# Patient Record
Sex: Female | Born: 1970 | Race: White | Hispanic: No | Marital: Married | Smoking: Never smoker
Health system: Southern US, Community
[De-identification: ages and names within clinical notes are randomized; demographics above are authoritative.]

## PROBLEM LIST (undated history)

## (undated) DIAGNOSIS — R51 Headache: Secondary | ICD-10-CM

## (undated) DIAGNOSIS — T7840XA Allergy, unspecified, initial encounter: Secondary | ICD-10-CM

## (undated) DIAGNOSIS — Z8619 Personal history of other infectious and parasitic diseases: Secondary | ICD-10-CM

## (undated) DIAGNOSIS — M25559 Pain in unspecified hip: Secondary | ICD-10-CM

## (undated) DIAGNOSIS — R519 Headache, unspecified: Secondary | ICD-10-CM

## (undated) DIAGNOSIS — N83209 Unspecified ovarian cyst, unspecified side: Secondary | ICD-10-CM

## (undated) HISTORY — PX: WISDOM TOOTH EXTRACTION: SHX21

## (undated) HISTORY — DX: Headache: R51

## (undated) HISTORY — DX: Headache, unspecified: R51.9

## (undated) HISTORY — PX: KNEE SURGERY: SHX244

## (undated) HISTORY — DX: Unspecified ovarian cyst, unspecified side: N83.209

## (undated) HISTORY — DX: Allergy, unspecified, initial encounter: T78.40XA

## (undated) HISTORY — DX: Personal history of other infectious and parasitic diseases: Z86.19

## (undated) HISTORY — DX: Pain in unspecified hip: M25.559

---

## 2004-05-26 ENCOUNTER — Inpatient Hospital Stay (HOSPITAL_COMMUNITY): Admission: AD | Admit: 2004-05-26 | Discharge: 2004-05-27 | Payer: Self-pay | Admitting: Obstetrics and Gynecology

## 2004-06-08 ENCOUNTER — Inpatient Hospital Stay (HOSPITAL_COMMUNITY): Admission: AD | Admit: 2004-06-08 | Discharge: 2004-06-09 | Payer: Self-pay | Admitting: Obstetrics and Gynecology

## 2004-06-15 ENCOUNTER — Inpatient Hospital Stay (HOSPITAL_COMMUNITY): Admission: AD | Admit: 2004-06-15 | Discharge: 2004-06-18 | Payer: Self-pay | Admitting: Obstetrics and Gynecology

## 2004-07-28 ENCOUNTER — Other Ambulatory Visit: Admission: RE | Admit: 2004-07-28 | Discharge: 2004-07-28 | Payer: Self-pay | Admitting: Obstetrics and Gynecology

## 2005-04-23 ENCOUNTER — Encounter: Admission: RE | Admit: 2005-04-23 | Discharge: 2005-04-23 | Payer: Self-pay | Admitting: Emergency Medicine

## 2005-07-23 ENCOUNTER — Ambulatory Visit (HOSPITAL_COMMUNITY): Admission: RE | Admit: 2005-07-23 | Discharge: 2005-07-23 | Payer: Self-pay | Admitting: Obstetrics and Gynecology

## 2005-08-01 ENCOUNTER — Encounter: Admission: RE | Admit: 2005-08-01 | Discharge: 2005-08-01 | Payer: Self-pay | Admitting: Obstetrics and Gynecology

## 2005-10-05 ENCOUNTER — Other Ambulatory Visit: Admission: RE | Admit: 2005-10-05 | Discharge: 2005-10-05 | Payer: Self-pay | Admitting: Obstetrics and Gynecology

## 2006-08-23 ENCOUNTER — Encounter: Admission: RE | Admit: 2006-08-23 | Discharge: 2006-08-23 | Payer: Self-pay | Admitting: Emergency Medicine

## 2006-12-10 ENCOUNTER — Ambulatory Visit (HOSPITAL_COMMUNITY): Admission: RE | Admit: 2006-12-10 | Discharge: 2006-12-10 | Payer: Self-pay | Admitting: Obstetrics and Gynecology

## 2007-05-30 ENCOUNTER — Encounter (INDEPENDENT_AMBULATORY_CARE_PROVIDER_SITE_OTHER): Payer: Self-pay | Admitting: Obstetrics and Gynecology

## 2007-05-30 ENCOUNTER — Ambulatory Visit (HOSPITAL_COMMUNITY): Admission: RE | Admit: 2007-05-30 | Discharge: 2007-05-30 | Payer: Self-pay | Admitting: Obstetrics and Gynecology

## 2008-04-05 ENCOUNTER — Inpatient Hospital Stay (HOSPITAL_COMMUNITY): Admission: AD | Admit: 2008-04-05 | Discharge: 2008-04-05 | Payer: Self-pay | Admitting: Family Medicine

## 2008-04-07 ENCOUNTER — Encounter (INDEPENDENT_AMBULATORY_CARE_PROVIDER_SITE_OTHER): Payer: Self-pay | Admitting: Obstetrics and Gynecology

## 2008-04-07 ENCOUNTER — Ambulatory Visit: Payer: Self-pay | Admitting: Vascular Surgery

## 2008-04-07 ENCOUNTER — Ambulatory Visit: Admission: RE | Admit: 2008-04-07 | Discharge: 2008-04-07 | Payer: Self-pay | Admitting: Obstetrics and Gynecology

## 2008-04-23 ENCOUNTER — Inpatient Hospital Stay (HOSPITAL_COMMUNITY): Admission: AD | Admit: 2008-04-23 | Discharge: 2008-04-25 | Payer: Self-pay | Admitting: Obstetrics and Gynecology

## 2008-04-27 ENCOUNTER — Ambulatory Visit: Admission: RE | Admit: 2008-04-27 | Discharge: 2008-04-27 | Payer: Self-pay | Admitting: Obstetrics and Gynecology

## 2009-03-03 ENCOUNTER — Emergency Department (HOSPITAL_BASED_OUTPATIENT_CLINIC_OR_DEPARTMENT_OTHER): Admission: EM | Admit: 2009-03-03 | Discharge: 2009-03-03 | Payer: Self-pay | Admitting: Emergency Medicine

## 2009-11-21 LAB — HM MAMMOGRAPHY: HM Mammogram: NORMAL

## 2009-11-23 ENCOUNTER — Emergency Department (HOSPITAL_COMMUNITY): Admission: EM | Admit: 2009-11-23 | Discharge: 2009-11-23 | Payer: Self-pay | Admitting: Emergency Medicine

## 2010-11-21 NOTE — H&P (Signed)
[name], Maria [name]          ACCOUNT NO.:  [id number]   MEDICAL RECORD NO.:  [id number]          PATIENT TYPE:  AMB   LOCATION:  SDC                           FACILITY:  [facility]   PHYSICIAN:  Juluis Mire, M.D.   DATE OF BIRTH:  1971-03-15   DATE OF ADMISSION:  05/30/2007  DATE OF DISCHARGE:                              HISTORY & PHYSICAL   The patient is [name] 36-year gravida 2, para 1 female, last menstrual period  August 23, given estimated date of confinement of May 31, estimated  gestational age of [redacted] weeks.  Was seen in the office yesterday for  evaluation.  Fetal heart tones were not audible.  Subsequent ultrasound  evaluation revealed [name] nonviable pregnancy.  Crown-rump length was  consistent with approximately 10 weeks and 5 days.  The patient was  brought in at the present time for dilatation and evacuation for  management of nonviable first trimester pregnancy.  Blood type is [name]+.   ALLERGIES:  NO KNOWN DRUG ALLERGIES.   MEDICATIONS:  Prenatal vitamins.   PAST MEDICAL HISTORY:  Usual childhood diseases.  No sequelae.  Only  previous surgical history is knee surgery.   OBSTETRICAL HISTORY:  One prior vaginal delivery.   FAMILY HISTORY:  Noncontributory.   SOCIAL HISTORY:  There is no tobacco or alcohol use.   REVIEW OF SYSTEMS:  Noncontributory.   PHYSICAL EXAMINATION:  VITAL SIGNS:  The patient is afebrile with stable  vital signs.  HEENT:  The patient is normocephalic.  Pupils equal, round and reactive  to light and accommodation.  Extraocular movements were intact.  Sclerae  and conjunctivae were clear.  Oropharynx clear.  NECK:  Without thyromegaly.  BREASTS:  Not examined.  LUNGS:  Clear.  CARDIOVASCULAR:  Regular rhythm and rate with [name] grade 2/6 systolic  ejection murmur.  No clicks or gallops.  ABDOMEN:  Examination was benign.  No mass, organomegaly, or tenderness.  PELVIC:  Normal external genitalia.  Vaginal mucosa clear.  Cervix  unremarkable.  Uterus  feels to be approximately 10 weeks in size.  Adnexa unremarkable.  EXTREMITIES:  Trace edema.  NEUROLOGIC:  Grossly within normal limits.   IMPRESSION:  Nonviable first trimester pregnancy.   PLAN:  The patient to undergo dilatation and evacuation.  The risks of  surgery have been explained, including the risk of infection.  Risk of  hemorrhage could require transfusion with the risk of AIDS or hepatitis.  Continued  bleeding could require hysterectomy.  Risk of uterine perforation could  lead to injury to adjacent organs, requiring exploratory surgery.  Risk  of deep venous thrombosis and pulmonary embolus.  The patient will not  require RhoGAM.  The patient expressed understanding of indications and  risks.      Juluis Mire, M.D.  Electronically Signed     JSM/MEDQ  D:  05/30/2007  T:  05/30/2007  Job:  [id number]

## 2010-11-21 NOTE — Op Note (Signed)
[name], [name]          ACCOUNT NO.:  [id number]   MEDICAL RECORD NO.:  [id number]          PATIENT TYPE:  AMB   LOCATION:  SDC                           [facility]  [facility]   PHYSICIAN:  Juluis Mire, M.D.   DATE OF BIRTH:  02/17/1971   DATE OF PROCEDURE:  05/30/2007  DATE OF DISCHARGE:                               OPERATIVE REPORT   PREOPERATIVE DIAGNOSIS:  Nonviable first trimester of pregnancy.   POSTOPERATIVE DIAGNOSIS:  Nonviable first trimester of pregnancy.   OPERATIVE PROCEDURE:  1. Paracervical block.  2. Cervical dilation with the intrauterine curettings and evacuation.   SURGEON:  Juluis Mire, M.D.   ANESTHESIA:  Sedation with paracervical block.   ESTIMATED BLOOD LOSS:  400-500 mL.   PACKS AND DRAINS:  None.   INTRAOPERATIVE BLOOD REPLACEMENT:  none.   COMPLICATIONS:  None.   INDICATIONS:  In dictated history and physical.   PROCEDURE:  The patient was taken to the OR and placed in a supine  position.  After a satisfactory level of sedation, she was placed in the  dorsal lithotomy position using the Allen stirrups.  At this point in  time, the patient was draped in a sterile field.  A speculum was placed  in the vaginal vault.  The cervix and vagina were cleansed with  Betadine.  Paracervical block was instituted using 1% Nesacaine.  Her  cervix was secured with a single-tooth tenaculum.  The uterus sounded to  11 cm.  The cervix was serially dilated to a size 29 Pratt dilator.  A  size 8 suction curette was introduced and we began emptying the  intrauterine contents.  She had very adherent products of conception and  it took several passes with the suction curette and additional sharp  curetting until we were eventually able to get the uterus to start to  contract down.  She was given IV Pitocin.  We continued suction and  sharp curetting until we felt that all quadrants were clear.  The uterus  was contracting down well at this time and there  was no evidence of  further retained products.  We did suffer about 400-500 mL of blood  loss.  At this point in time, there  was no active bleeding, no signs of perforation.  The speculum and  single-tooth tenaculum were removed.  The patient was taken out of the  dorsal lithotomy position and once alert, the patient will be  transferred to recovery room in good condition.  Sponge, instrument and  needle counts were reported as correct by the circulating nurse.      Juluis Mire, M.D.  Electronically Signed     JSM/MEDQ  D:  05/30/2007  T:  05/31/2007  Job:  [id number]

## 2011-04-09 LAB — CBC
HCT: 31.6 — ABNORMAL LOW
HCT: 36.1
Hemoglobin: 10.5 — ABNORMAL LOW
Hemoglobin: 12.1
MCHC: 33.2
MCHC: 33.5
MCV: 87.8
MCV: 88
Platelets: 169
Platelets: 224
RBC: 3.59 — ABNORMAL LOW
RBC: 4.11
RDW: 14.6
RDW: 14.7
WBC: 10.9 — ABNORMAL HIGH
WBC: 11.7 — ABNORMAL HIGH

## 2011-04-09 LAB — RPR: RPR Ser Ql: NONREACTIVE

## 2011-04-17 LAB — CBC
HCT: 41.7
Hemoglobin: 14.2
MCHC: 34.1
MCV: 86.3
Platelets: 234
RBC: 4.83
RDW: 13.9
WBC: 9.3

## 2011-11-09 ENCOUNTER — Encounter (HOSPITAL_COMMUNITY): Payer: Self-pay | Admitting: Emergency Medicine

## 2011-11-09 ENCOUNTER — Emergency Department (HOSPITAL_COMMUNITY): Payer: 59

## 2011-11-09 ENCOUNTER — Emergency Department (HOSPITAL_COMMUNITY)
Admission: EM | Admit: 2011-11-09 | Discharge: 2011-11-10 | Disposition: A | Discharge status: Home / Self Care | Payer: 59 | Attending: Emergency Medicine | Admitting: Emergency Medicine

## 2011-11-09 DIAGNOSIS — R079 Chest pain, unspecified: Secondary | ICD-10-CM | POA: Insufficient documentation

## 2011-11-09 DIAGNOSIS — Z79899 Other long term (current) drug therapy: Secondary | ICD-10-CM | POA: Insufficient documentation

## 2011-11-09 DIAGNOSIS — R110 Nausea: Secondary | ICD-10-CM | POA: Insufficient documentation

## 2011-11-09 DIAGNOSIS — M542 Cervicalgia: Secondary | ICD-10-CM | POA: Insufficient documentation

## 2011-11-09 DIAGNOSIS — Z7982 Long term (current) use of aspirin: Secondary | ICD-10-CM | POA: Insufficient documentation

## 2011-11-09 DIAGNOSIS — I4949 Other premature depolarization: Secondary | ICD-10-CM | POA: Insufficient documentation

## 2011-11-09 LAB — CBC
HCT: 40.7 % (ref 36.0–46.0)
Hemoglobin: 14.1 g/dL (ref 12.0–15.0)
MCH: 29.6 pg (ref 26.0–34.0)
MCHC: 34.6 g/dL (ref 30.0–36.0)
MCV: 85.3 fL (ref 78.0–100.0)
Platelets: 216 10*3/uL (ref 150–400)
RDW: 12.7 % (ref 11.5–15.5)

## 2011-11-09 LAB — BASIC METABOLIC PANEL
BUN: 11 mg/dL (ref 6–23)
Chloride: 100 mEq/L (ref 96–112)
Creatinine, Ser: 0.66 mg/dL (ref 0.50–1.10)
GFR calc Af Amer: >90 mL/min (ref >90)
GFR calc non Af Amer: >90 mL/min (ref >90)
Potassium: 3.7 mEq/L (ref 3.5–5.1)
Sodium: 137 mEq/L (ref 135–145)

## 2011-11-09 LAB — DIFFERENTIAL
Basophils Absolute: 0 10*3/uL (ref 0.0–0.1)
Basophils Relative: 0 % (ref 0–1)
Eosinophils Absolute: 0.3 10*3/uL (ref 0.0–0.7)
Lymphocytes Relative: 36 % (ref 12–46)
Lymphs Abs: 3.4 10*3/uL (ref 0.7–4.0)
Monocytes Absolute: 0.8 10*3/uL (ref 0.1–1.0)
Monocytes Relative: 8 % (ref 3–12)
Neutro Abs: 5 10*3/uL (ref 1.7–7.7)
Neutrophils Relative %: 53 % (ref 43–77)

## 2011-11-09 LAB — POCT I-STAT TROPONIN I: Troponin i, poc: 0.01 ng/mL (ref 0.00–0.08)

## 2011-11-09 NOTE — ED Notes (Signed)
C/o sudden onset of palpitations, chest pain that radiates to neck  and nausea that began before dinner. Denies SOB and dizziness. Unifocal PVCs on  Monitor. Pt is alert and oriented.

## 2011-11-09 NOTE — ED Notes (Signed)
PT. REPORTS MID CHEST PAIN / NECK PAIN WITH PALPITATIONS AND AND NAUSEA ONSET YESTERDAY .

## 2011-11-10 LAB — D-DIMER, QUANTITATIVE: D-Dimer, Quant: <0.22 ug/mL-FEU (ref 0.00–0.48)

## 2011-11-10 LAB — TROPONIN I
Troponin I: <0.3 ng/mL (ref <0.30)
Troponin I: <0.3 ng/mL (ref <0.30)

## 2011-11-10 MED ORDER — ASPIRIN 81 MG PO CHEW
324.0000 mg | CHEWABLE_TABLET | Freq: Once | ORAL | Status: AC
  Administered 2011-11-10: 324 mg via ORAL
  Filled 2011-11-10: qty 4

## 2011-11-10 MED ORDER — HYDROCODONE-ACETAMINOPHEN 5-325 MG PO TABS: 1.0000 | ORAL_TABLET | Freq: Four times a day (QID) | ORAL | Status: AC | PRN

## 2011-11-10 MED ORDER — HYDROCODONE-ACETAMINOPHEN 5-325 MG PO TABS: 1.0000 | ORAL_TABLET | Freq: Once | ORAL | Status: DC

## 2011-11-10 MED ORDER — ASPIRIN 81 MG PO CHEW: 81.0000 mg | CHEWABLE_TABLET | Freq: Every day | ORAL | Status: DC

## 2011-11-10 NOTE — Discharge Instructions (Signed)
Aspirin and Your Heart Aspirin affects the way your blood clots and helps "thin" the blood. Aspirin has many uses in heart disease. It may be used as a primary prevention to help reduce the risk of heart related events. It also can be used as a secondary measure to prevent more heart attacks or to prevent additional damage from blood clots.  ASPIRIN MAY HELP IF YOU:  Have had a heart attack or chest pain.   Have undergone open heart surgery such as CABG (Coronary Artery Bypass Surgery).   Have had coronary angioplasty with or without stents.   Have experienced a stroke or TIA (transient ischemic attack).   Have peripheral vascular disease (PAD).   Have chronic heart rhythm problems such as atrial fibrillation.   Are at risk for heart disease.  BEFORE STARTING ASPIRIN Before you start taking aspirin, your caregiver will need to review your medical history. Many things will need to be taken into consideration, such as:  Smoking status.   Blood pressure.   Diabetes.   Gender.   Weight.   Cholesterol level.  ASPIRIN DOSES  Aspirin should only be taken on the advice of your caregiver. Talk to your caregiver about how much aspirin you should take. Aspirin comes in different doses such as:   81 mg.   162 mg.   325 mg.   The aspirin dose you take may be affected by many factors, some of which include:   Your current medications, especially if your are taking blood-thinners or anti-platelet medicine.   Liver function.   Heart disease risk.   Age.   Aspirin comes in two forms:   Non-enteric-coated. This type of aspirin does not have a coating and is absorbed faster. Non-enteric coated aspirin is recommended for patients experiencing chest pain symptoms. This type of aspirin also comes in a chewable form.   Enteric-coated. This means the aspirin has a special coating that releases the medicine very slowly. Enteric-coated aspirin causes less stomach upset. This type of  aspirin should not be chewed or crushed.  ASPIRIN SIDE EFFECTS Daily use of aspirin can increase your risk of serious side effects, some of these include:  Increased bleeding. This can range from a cut that does not stop bleeding to more serious problems such as stomach bleeding or bleeding into the brain (Intracerebral bleeding).   Increased bruising.   Stomach upset.   An allergic reaction such as red, itchy skin.   Increased risk of bleeding when combined with non-steroidal anti-inflammatory medicine (NSAIDS).   Alcohol should be drank in moderation when taking aspirin. Alcohol can increase the risk of stomach bleeding when taken with aspirin.   Aspirin should not be given to children less than 18 years of age due to the association of Reye syndrome. Reye syndrome is a serious illness that can affect the brain and liver. Studies have linked Reye syndrome with aspirin use in children.   People that have nasal polyps have an increased risk of developing an aspirin allergy.  SEEK MEDICAL CARE IF:   You develop an allergic reaction such as:   Hives.   Itchy skin.   Swelling of the lips, tongue or face.   You develop stomach pain.   You have unusual bleeding or bruising.   You have ringing in your ears.  SEEK IMMEDIATE MEDICAL CARE IF:   You have severe chest pain, especially if the pain is crushing or pressure-like and spreads to the arms, back, neck, or jaw. THIS   IS AN EMERGENCY. Do not wait to see if the pain will go away. Get medical help at once. Call your local emergency services (911 in the [place] DO NOT drive yourself to the hospital.   You have stroke-like symptoms such as:   Loss of vision.   Difficulty talking.   Numbness or weakness on one side of your body.   Numbness or weakness in your arm or leg.   Not thinking clearly or feeling confused.   Your bowel movements are bloody, dark red or black in color.   You vomit or cough up blood.   You have blood  in your urine.   You have shortness of breath, coughing or wheezing.  MAKE SURE YOU:   Understand these instructions.   Will monitor your condition.   Seek immediate medical care if necessary.  Document Released: 06/07/2008 Document Revised: 06/14/2011 Document Reviewed: 06/07/2008 [facility] Patient Information 2012 [place], [place].  Call cardiology for followup. Start taking a baby aspirin a day. Return for new or persistent chest pain that does not resolve within 20 minutes.  RESOURCE GUIDE  Dental Problems  Patients with Medicaid: [facility] [phone] [address]                                           [phone] [address] Phone:  [phone]                                                  Phone:  [phone]  If unable to pay or uninsured, contact:  [facility] or [facility]. to become qualified for the adult dental clinic.  Chronic Pain Problems Contact [facility]  [phone] Patients need to be referred by their primary care doctor.  Insufficient Money for Medicine Contact [employer]:  call "211" or [facility] [phone].  No Primary Care Doctor Call [facility]  [phone] Other agencies that provide inexpensive medical care    [facility]  [phone]    [facility]  [phone]    [facility]  [phone]    [facility]  [phone]    [facility]  [phone]    [facility]  [phone]  Psychological Services [facility] Behavioral Health  [phone] [facility] Services  [phone] [facility] Mental Health   [phone] (emergency services [phone])  Substance Abuse Resources [facility]  [phone] [facility] [phone] [facility] [phone] Floydene Flock [phone] Residential & Outpatient Substance Abuse Program  7090162238  Abuse/Neglect [facility] Child Abuse  Hotline [phone] [facility] Child Abuse Hotline [phone] (After Hours)  Emergency Shelter [facility] [phone]  Maternity Homes [facility] at the [place] [facility] [phone] [place] [facility] [phone]  MRSA Hotline #:   737-347-3385    [facility]  Health Dept. [address] [place]                       [address]      [address]  Blondell Reveal Phone:  [phone]                                   Phone:  [phone]                 Phone:  [phone]  [facility] Mental Health Phone:  [phone]  [facility] Child Abuse Hotline [phone] (After Hours)

## 2011-11-10 NOTE — ED Provider Notes (Addendum)
History     CSN: [id number]  Arrival date & time 11/09/11  2058   First MD Initiated Contact with Patient 11/09/11 2318      Chief Complaint  Patient presents with  . Chest Pain    (Consider location/radiation/quality/duration/timing/severity/associated sxs/prior treatment) Patient is a 41 y.o. female presenting with chest pain. The history is provided by the patient and the spouse.  Chest Pain The chest pain began 3 - 5 hours ago. Chest pain occurs constantly. The chest pain is improving. At its most intense, the pain is at 9/10. The pain is currently at 7/10. The severity of the pain is moderate. The quality of the pain is described as aching and dull. The pain radiates to the left neck. Primary symptoms include nausea. Pertinent negatives for primary symptoms include no fever, no syncope, no shortness of breath, no cough, no wheezing, no palpitations, no abdominal pain, no vomiting and no dizziness.    patient with acute onset of midsternal chest pain at around 7:30 that moved to left lateral neck currently improving currently 7/10 did not take an aspirin. Associated with some nausea but no shortness of breath. Patient stated there was a family history of cardiac disease but with questioning no strong evidence of any cardiac events below the age of 67 there is a history of SVT.  History reviewed. No pertinent past medical history.  History reviewed. No pertinent past surgical history.  No family history on file.  History  Substance Use Topics  . Smoking status: Never Smoker   . Smokeless tobacco: Not on file  . Alcohol Use: No    OB History    Grav Para Term Preterm Abortions TAB SAB Ect Mult Living                  Review of Systems  Constitutional: Negative for fever.  HENT: Positive for neck pain.   Eyes: Negative for visual disturbance.  Respiratory: Negative for cough, shortness of breath and wheezing.   Cardiovascular: Positive for chest pain. Negative for  palpitations and syncope.  Gastrointestinal: Positive for nausea. Negative for vomiting and abdominal pain.  Genitourinary: Negative for dysuria.  Musculoskeletal: Negative for back pain.  Skin: Negative for rash.  Neurological: Negative for dizziness and headaches.  Hematological: Does not bruise/bleed easily.    Allergies  Review of patient's allergies indicates no known allergies.  Home Medications   Current Outpatient Rx  Name Route Sig Dispense Refill  . CETIRIZINE HCL 10 MG PO TABS Oral Take 10 mg by mouth daily.    . ADULT MULTIVITAMIN W/MINERALS CH Oral Take 1 tablet by mouth daily.    . ASPIRIN 81 MG PO CHEW Oral Chew 1 tablet (81 mg total) by mouth daily. 30 tablet 3    BP 90/43  Pulse 69  Temp(Src) 97.8 F (36.6 C) (Oral)  Resp 18  SpO2 100%  LMP 10/25/2011  Physical Exam  Nursing note and vitals reviewed. Constitutional: She is oriented to person, place, and time. She appears well-developed and well-nourished. No distress.  HENT:  Head: Normocephalic and atraumatic.  Mouth/Throat: Oropharynx is clear and moist.  Eyes: Conjunctivae and EOM are normal. Pupils are equal, round, and reactive to light.  Neck: Normal range of motion. Neck supple.  Cardiovascular: Normal rate, regular rhythm and normal heart sounds.   No murmur heard.      occassional premature beat  Pulmonary/Chest: Effort normal and breath sounds normal. No respiratory distress. She has no wheezes. She has  no rales. She exhibits no tenderness.  Abdominal: Soft. Bowel sounds are normal. There is no tenderness.  Musculoskeletal: Normal range of motion. She exhibits no edema and no tenderness.  Neurological: She is alert and oriented to person, place, and time. No cranial nerve deficit. She exhibits normal muscle tone. Coordination normal.  Skin: Skin is warm. No rash noted. No erythema.    ED Course  Procedures (including critical care time)  Labs Reviewed  BASIC METABOLIC PANEL - Abnormal;  Notable for the following:    Glucose, Bld 122 (*)    All other components within normal limits  CBC  DIFFERENTIAL  POCT I-STAT TROPONIN I  D-DIMER, QUANTITATIVE  TROPONIN I  TROPONIN I   Dg Chest 2 View  11/09/2011  *RADIOLOGY REPORT*  Clinical Data: Chest pain  CHEST - 2 VIEW  Comparison: Nov 23, 2009  Findings: The cardiac silhouette, mediastinum, pulmonary vasculature are within normal limits.  Both lungs are clear. There is no acute bony abnormality.  IMPRESSION: There is no evidence of acute cardiac or pulmonary process.  Original Report Authenticated By: Brandon Melnick, M.D.   Results for orders placed during the hospital encounter of 11/09/11  CBC      Component Value Range   WBC 9.5  4.0 - 10.5 (K/uL)   RBC 4.77  3.87 - 5.11 (MIL/uL)   Hemoglobin 14.1  12.0 - 15.0 (g/dL)   HCT 16.1  09.6 - 04.5 (%)   MCV 85.3  78.0 - 100.0 (fL)   MCH 29.6  26.0 - 34.0 (pg)   MCHC 34.6  30.0 - 36.0 (g/dL)   RDW 40.9  81.1 - 91.4 (%)   Platelets 216  150 - 400 (K/uL)  DIFFERENTIAL      Component Value Range   Neutrophils Relative 53  43 - 77 (%)   Neutro Abs 5.0  1.7 - 7.7 (K/uL)   Lymphocytes Relative 36  12 - 46 (%)   Lymphs Abs 3.4  0.7 - 4.0 (K/uL)   Monocytes Relative 8  3 - 12 (%)   Monocytes Absolute 0.8  0.1 - 1.0 (K/uL)   Eosinophils Relative 3  0 - 5 (%)   Eosinophils Absolute 0.3  0.0 - 0.7 (K/uL)   Basophils Relative 0  0 - 1 (%)   Basophils Absolute 0.0  0.0 - 0.1 (K/uL)  BASIC METABOLIC PANEL      Component Value Range   Sodium 137  135 - 145 (mEq/L)   Potassium 3.7  3.5 - 5.1 (mEq/L)   Chloride 100  96 - 112 (mEq/L)   CO2 28  19 - 32 (mEq/L)   Glucose, Bld 122 (*) 70 - 99 (mg/dL)   BUN 11  6 - 23 (mg/dL)   Creatinine, Ser 7.82  0.50 - 1.10 (mg/dL)   Calcium 9.3  8.4 - 95.6 (mg/dL)   GFR calc non Af Amer >90  >90 (mL/min)   GFR calc Af Amer >90  >90 (mL/min)  POCT I-STAT TROPONIN I      Component Value Range   Troponin i, poc 0.01  0.00 - 0.08 (ng/mL)   Comment  3           D-DIMER, QUANTITATIVE      Component Value Range   D-Dimer, Quant <0.22  0.00 - 0.48 (ug/mL-FEU)  TROPONIN I      Component Value Range   Troponin I <0.30  <0.30 (ng/mL)  TROPONIN I      Component Value  Range   Troponin I <0.30  <0.30 (ng/mL)    Date: 11/10/2011  Rate: 88  Rhythm: normal sinus rhythm  QRS Axis: normal  Intervals: normal  ST/T Wave abnormalities: normal  Conduction Disutrbances:none  Narrative Interpretation:   Old EKG Reviewed: none available Frequent PVCs noted.   1. Chest pain       MDM  Today's workup negative for acute cardiac event patient has had troponins x3 and one definitely at the six-hour mark all extremely normal EKG without any significant findings other than occasional premature ventricular contractions patient to monitor the whole time in the sole that was seen her chest pain did go away with aspirin however. The left-sided neck discomfort has persisted d-dimer was negative for PE chest x-ray was negative for pneumonia or pneumothorax. The rest of patient's electrolytes are negative. Based on her age not likely to be cardiac in nature however will get cardiology referral and arrange for followup for her with primary care doctor at [facility] [place]. Also recommend starting a baby aspirin a day.        Shelda Jakes, MD 11/10/11 9604  Shelda Jakes, MD 11/11/11 2052

## 2011-11-10 NOTE — ED Notes (Signed)
Patient is AOx4 and comfortable with her discharge instructions. 

## 2011-11-15 LAB — HM PAP SMEAR: HM Pap smear: NORMAL

## 2011-11-16 ENCOUNTER — Ambulatory Visit: Payer: 59 | Admitting: Internal Medicine

## 2011-11-22 ENCOUNTER — Encounter: Payer: Self-pay | Admitting: *Deleted

## 2011-11-23 ENCOUNTER — Encounter: Payer: Self-pay | Admitting: *Deleted

## 2011-11-23 ENCOUNTER — Ambulatory Visit (INDEPENDENT_AMBULATORY_CARE_PROVIDER_SITE_OTHER): Payer: 59 | Admitting: Cardiovascular Disease

## 2011-11-23 DIAGNOSIS — R002 Palpitations: Secondary | ICD-10-CM

## 2011-11-23 DIAGNOSIS — R079 Chest pain, unspecified: Secondary | ICD-10-CM

## 2011-11-23 DIAGNOSIS — M542 Cervicalgia: Secondary | ICD-10-CM

## 2011-11-23 DIAGNOSIS — M25559 Pain in unspecified hip: Secondary | ICD-10-CM | POA: Insufficient documentation

## 2011-11-23 NOTE — Assessment & Plan Note (Signed)
Likely muscular.  F/U carotid to R.O carotid disection

## 2011-11-23 NOTE — Patient Instructions (Signed)
Your physician has requested that you have an exercise tolerance test. For further information please visit [email or web address] Please also follow instruction sheet, as given.  Your physician has requested that you have a carotid duplex. This test is an ultrasound of the carotid arteries in your neck. It looks at blood flow through these arteries that supply the brain with blood. Allow one hour for this exam. There are no restrictions or special instructions.  Your physician recommends that you schedule a follow-up appointment as needed with Dr. Eden Emms.

## 2011-11-23 NOTE — Progress Notes (Signed)
Patient ID: [name], female   DOB: 1970/12/22, 41 y.o.   MRN: [id number] 41 yo with episode of presyncope, SSCP and palpitations about 6 weeks ago.  W/U in ER reveiwed. Occasional PVC.  R/O CXR, and labs normal.  Has had one panic attack in past.  Was just sitting eating a cheesburger with family got hot and flushed, with sinking feeling in heart like going down a roller coaster.  Had searing sharp pain on right side of neck No focal neuro signs.  Was in ER for a couple of hours before felt better. No recurrene. No SSCP in general No previous MVP or heart disease.  No stimulants or drugs.    ROS: Denies fever, malais, weight loss, blurry vision, decreased visual acuity, cough, sputum, SOB, hemoptysis, pleuritic pain, palpitaitons, heartburn, abdominal pain, melena, lower extremity edema, claudication, or rash.  All other systems reviewed and negative   General: Affect appropriate Healthy:  appears stated age HEENT: normal Neck supple with no adenopathy JVP normal no bruits no thyromegaly Lungs clear with no wheezing and good diaphragmatic motion Heart:  S1/S2 no murmur,rub, gallop or click PMI normal Abdomen: benighn, BS positve, no tenderness, no AAA no bruit.  No HSM or HJR Distal pulses intact with no bruits No edema Neuro non-focal Skin warm and dry No muscular weakness  Medications Current Outpatient Prescriptions  Medication Sig Dispense Refill  . Calcium Carbonate-Vitamin D (CALCIUM + D PO) Take 1 tablet by mouth daily.      . cetirizine (ZYRTEC) 10 MG tablet Take 10 mg by mouth as needed.       . Coenzyme Q10 (COQ10 PO) Take 1 tablet by mouth daily.      . Nutritional Supplements (JUICE PLUS FIBRE PO) Take 4 tablets by mouth daily.        Allergies Review of patient's allergies indicates no known allergies.  Family History: Family History  Problem Relation Age of Onset  . Heart attack Father   . Supraventricular tachycardia Sister     Social  History: History   Social History  . Marital Status: Married    Spouse Name: N/A    Number of Children: N/A  . Years of Education: N/A   Occupational History  . Not on file.   Social History Main Topics  . Smoking status: Never Smoker   . Smokeless tobacco: Not on file  . Alcohol Use: No  . Drug Use: No  . Sexually Active:    Other Topics Concern  . Not on file   Social History Narrative  . No narrative on file    Electrocardiogram: 11/12/11  NSR PVC otherwise normal  Assessment and Plan

## 2011-11-23 NOTE — Assessment & Plan Note (Signed)
In setting of "attack" ? Panic PVC on telemetryh.  F/U ETT and Echo

## 2011-11-23 NOTE — Assessment & Plan Note (Signed)
Atypical  F/U ETT  

## 2011-11-27 ENCOUNTER — Ambulatory Visit (INDEPENDENT_AMBULATORY_CARE_PROVIDER_SITE_OTHER): Payer: 59 | Admitting: Internal Medicine

## 2011-11-27 ENCOUNTER — Encounter: Payer: Self-pay | Admitting: Internal Medicine

## 2011-11-27 VITALS — BP 108/60 | HR 76 | Temp 97.9°F | Resp 16 | Ht 68.5 in | Wt 144.0 lb

## 2011-11-27 DIAGNOSIS — M542 Cervicalgia: Secondary | ICD-10-CM

## 2011-11-27 NOTE — Patient Instructions (Signed)
Please have your neck xray done on the first floor at your convenience

## 2011-11-28 ENCOUNTER — Encounter (INDEPENDENT_AMBULATORY_CARE_PROVIDER_SITE_OTHER): Payer: 59

## 2011-11-28 DIAGNOSIS — R42 Dizziness and giddiness: Secondary | ICD-10-CM

## 2011-12-08 NOTE — Assessment & Plan Note (Signed)
Components of hx suggest msk etiology. Recommend completion of cardiac evaluation first however.return to clinic after finished with cardiology.

## 2011-12-08 NOTE — Progress Notes (Signed)
  Subjective:    Patient ID: [name], female    DOB: July 08, 1971, 41 y.o.   MRN: [id number]  HPI Pt presents to clinic for evaluation of neck pain. Seen recently in ED and by cardiology. Episode of chest pain and neck pain. Currently undergoing cardiology evaluation with EST, echo and carotid US scheduled. Part of hx seems to included left lateral neck pain without focal weakness or paresthesia. Taking no medication for the problem. Followed by gyn and underwent lipid evaluation last week.   Past Medical History  Diagnosis Date  . Chest pain   . Hip pain   . History of chicken pox     childhood   Past Surgical History  Procedure Date  . Knee surgery     reports that she has never smoked. She has never used smokeless tobacco. She reports that she does not drink alcohol or use illicit drugs. family history includes Alcohol abuse in her father; Breast cancer in her maternal grandmother; Healthy in her brother and sister; Heart attack in her father; and Supraventricular tachycardia in her sister.  There is no history of Prostate cancer, and Colon cancer, and Hypertension, and Diabetes, . No Known Allergies   Review of Systems  Respiratory: Negative for shortness of breath.   Cardiovascular: Positive for chest pain.  Musculoskeletal: Negative for back pain.  All other systems reviewed and are negative.       Objective:   Physical Exam  Nursing note and vitals reviewed. Constitutional: She appears well-developed and well-nourished. No distress.  HENT:  Head: Normocephalic and atraumatic.  Right Ear: External ear normal.  Left Ear: External ear normal.  Eyes: Conjunctivae are normal. No scleral icterus.  Neck: Neck supple.  Cardiovascular: Normal rate, regular rhythm and normal heart sounds.   Pulmonary/Chest: Effort normal and breath sounds normal. No respiratory distress. She has no wheezes. She has no rales.  Neurological: She is alert.  Skin: Skin is warm and dry. She  is not diaphoretic.          Assessment & Plan:

## 2011-12-13 ENCOUNTER — Encounter: Payer: Self-pay | Admitting: Cardiovascular Disease

## 2011-12-13 ENCOUNTER — Ambulatory Visit (INDEPENDENT_AMBULATORY_CARE_PROVIDER_SITE_OTHER): Payer: 59 | Admitting: Physician Assistant

## 2011-12-13 DIAGNOSIS — R079 Chest pain, unspecified: Secondary | ICD-10-CM

## 2011-12-13 NOTE — Procedures (Signed)
Exercise Treadmill Test  Pre-Exercise Testing Evaluation Rhythm: normal sinus  Rate: 79   PR:  .14 QRS:  .09  QT:  .37 QTc: .42     Test  Exercise Tolerance Test Ordering MD: Charlton Haws, MD  Interpreting MD: Mliss Fritz  Unique Test No: 1  Treadmill:  1  Indication for ETT: chest pain - rule out ischemia  Contraindication to ETT: No   Stress Modality: exercise - treadmill  Cardiac Imaging Performed: non   Protocol: standard Bruce - maximal  Max BP: 129/69  Max MPHR (bpm): 181 85% MPR (bpm):  153  MPHR obtained (bpm):  181 % MPHR obtained:  100%  Reached 85% MPHR (min:sec):  8:16 Total Exercise Time (min-sec):  9:43  Workload in METS:  11.2 Borg Scale: 17  Reason ETT Terminated:  patient's desire to stop    ST Segment Analysis At Rest: normal ST segments - no evidence of significant ST depression With Exercise: no evidence of significant ST depression  Other Information Arrhythmia:  No Angina during ETT:  absent (0) Quality of ETT:  diagnostic  ETT Interpretation:  normal - no evidence of ischemia by ST analysis  Comments: Good exercise tolerance. No chest pain. Normal BP response to exercise. No ST-T changes to suggest ischemia.   Recommendations: Follow up with Dr. Charlton Haws as directed. Tereso Newcomer, PA-C  3:06 PM 12/13/2011

## 2012-02-06 ENCOUNTER — Telehealth: Payer: Self-pay | Admitting: *Deleted

## 2012-02-06 NOTE — Telephone Encounter (Signed)
LEFT MESSAGE  FOR [name] PT TO CALL BACK RE  ORDER THAT WAS FAXED OVER NOT SURE WHAT FORM SAYS NEED CLARIFICATION AWAITING RETURN CALL./CY

## 2012-02-07 NOTE — Telephone Encounter (Signed)
PERIANAL BIOFEEDBACK NEEDS OKAY FROM CARDIOLOGY  IF PT HAS ARRYTHMIA./CY

## 2012-02-12 NOTE — Telephone Encounter (Signed)
FORM FAXED YESTERDAY  PT OKAY TO HAVE  PER DR NISHAN./CY

## 2012-07-21 ENCOUNTER — Other Ambulatory Visit: Payer: Self-pay | Admitting: Otolaryngology

## 2012-07-21 DIAGNOSIS — K219 Gastro-esophageal reflux disease without esophagitis: Secondary | ICD-10-CM

## 2012-07-21 DIAGNOSIS — R1319 Other dysphagia: Secondary | ICD-10-CM

## 2012-07-24 ENCOUNTER — Ambulatory Visit
Admission: RE | Admit: 2012-07-24 | Discharge: 2012-07-24 | Disposition: A | Discharge status: Home / Self Care | Payer: 59 | Source: Ambulatory Visit | Attending: Otolaryngology | Admitting: Otolaryngology

## 2012-07-24 DIAGNOSIS — K219 Gastro-esophageal reflux disease without esophagitis: Secondary | ICD-10-CM

## 2012-07-24 DIAGNOSIS — R1319 Other dysphagia: Secondary | ICD-10-CM

## 2012-10-08 ENCOUNTER — Other Ambulatory Visit: Payer: Self-pay | Admitting: Dermatology

## 2012-12-02 ENCOUNTER — Other Ambulatory Visit: Payer: Self-pay | Admitting: Dermatology

## 2013-02-06 DIAGNOSIS — N83209 Unspecified ovarian cyst, unspecified side: Secondary | ICD-10-CM

## 2013-02-06 HISTORY — DX: Unspecified ovarian cyst, unspecified side: N83.209

## 2013-03-24 ENCOUNTER — Encounter: Payer: Self-pay | Admitting: Physician Assistant

## 2013-03-24 ENCOUNTER — Ambulatory Visit (INDEPENDENT_AMBULATORY_CARE_PROVIDER_SITE_OTHER): Payer: 59 | Admitting: Physician Assistant

## 2013-03-24 ENCOUNTER — Ambulatory Visit (HOSPITAL_BASED_OUTPATIENT_CLINIC_OR_DEPARTMENT_OTHER)
Admission: RE | Admit: 2013-03-24 | Discharge: 2013-03-24 | Disposition: A | Discharge status: Home / Self Care | Payer: 59 | Source: Ambulatory Visit | Attending: Physician Assistant | Admitting: Physician Assistant

## 2013-03-24 VITALS — BP 106/64 | HR 83 | Temp 98.4°F | Resp 10 | Ht 68.5 in | Wt 137.1 lb

## 2013-03-24 DIAGNOSIS — R1031 Right lower quadrant pain: Secondary | ICD-10-CM

## 2013-03-24 DIAGNOSIS — R1032 Left lower quadrant pain: Secondary | ICD-10-CM | POA: Insufficient documentation

## 2013-03-24 DIAGNOSIS — R52 Pain, unspecified: Secondary | ICD-10-CM

## 2013-03-24 DIAGNOSIS — R110 Nausea: Secondary | ICD-10-CM | POA: Insufficient documentation

## 2013-03-24 LAB — BASIC METABOLIC PANEL
Calcium: 9.4 mg/dL (ref 8.4–10.5)
Glucose, Bld: 89 mg/dL (ref 70–99)
Sodium: 138 mEq/L (ref 135–145)

## 2013-03-24 LAB — CBC WITH DIFFERENTIAL/PLATELET
Basophils Absolute: 0 10*3/uL (ref 0.0–0.1)
Basophils Relative: 0 % (ref 0–1)
HCT: 42.7 % (ref 36.0–46.0)
MCHC: 33.7 g/dL (ref 30.0–36.0)
Monocytes Absolute: 0.5 10*3/uL (ref 0.1–1.0)
Neutro Abs: 4.2 10*3/uL (ref 1.7–7.7)
Neutrophils Relative %: 58 % (ref 43–77)
Platelets: 216 10*3/uL (ref 150–400)
RDW: 13.9 % (ref 11.5–15.5)

## 2013-03-24 MED ORDER — IOHEXOL 300 MG/ML  SOLN
100.0000 mL | Freq: Once | INTRAMUSCULAR | Status: AC | PRN
  Administered 2013-03-24: 100 mL via INTRAVENOUS

## 2013-03-24 NOTE — Patient Instructions (Signed)
Please obtain labs then proceed directly to imaging on first floor to get your contrast.  I will call you with the results.  Please have Imaging call me with the results before you leave.

## 2013-03-24 NOTE — Assessment & Plan Note (Signed)
CBC, BMET, ESR.  Recent pelvic US negative for adnexal or uterine abnormalities.  Will obtain CT ABD/PELVIS.  Pain could be attributable to recent rupture of ovarian cyst -- can cause irritation of other visceral structures.  Ibuprofen as needed for pain.  Will treat patient according to results.  Patient instructed to present to ED if symptoms acutely worsen.

## 2013-03-24 NOTE — Progress Notes (Signed)
Patient ID: [name], female   DOB: 19-May-1971, 42 y.o.   MRN: [id number]  Patient presents to clinic today c/o RLQ pain that started a few days ago but has increased in severity over the past day.  Patient states pain is associated with some nausea but no vomiting.  Denies change in bowel habits, melena, hematochezia.  Denies prior abdominal surgery.  Patient was seen by OBGYN 1.3 weeks ago for LLQ pain, was diagnosed by [place] with ruptured ovarian cyst and was given pain medicine.  LLQ  Pain has subsided.  Patient states US showed no abnormalities of the R adnexa.  Denies vaginal bleeding or discharge.  Patient uses IUD for contraception.  Denies urinary urgency, frequency, hematuria, flank pain.  Past Medical History  Diagnosis Date  . Chest pain   . Hip pain   . History of chicken pox     childhood  . Ovarian cyst 8/14    did rupture     Current Outpatient Prescriptions on File Prior to Visit  Medication Sig Dispense Refill  . Calcium Carbonate-Vitamin D (CALCIUM + D PO) Take 1 tablet by mouth daily.      . cetirizine (ZYRTEC) 10 MG tablet Take 10 mg by mouth as needed.       . Coenzyme Q10 (COQ10 PO) Take 1 tablet by mouth daily.      . Nutritional Supplements (JUICE PLUS FIBRE PO) Take 4 tablets by mouth daily.       No current facility-administered medications on file prior to visit.    No Known Allergies  Family History  Problem Relation Age of Onset  . Heart attack Father   . Supraventricular tachycardia Sister     younger sister  . Breast cancer Maternal Grandmother   . Alcohol abuse Father   . Prostate cancer Neg Hx   . Colon cancer Neg Hx   . Hypertension Neg Hx   . Diabetes Neg Hx   . Healthy Brother     1 of 1  . Healthy Sister     1 of 3    History   Social History  . Marital Status: Married    Spouse Name: N/A    Number of Children: N/A  . Years of Education: N/A   Social History Main Topics  . Smoking status: Never Smoker   . Smokeless  tobacco: Never Used  . Alcohol Use: No  . Drug Use: No  . Sexual Activity: None   Other Topics Concern  . None   Social History Narrative  . None   Review of Systems  Constitutional: Negative for fever, chills and weight loss.  Cardiovascular: Negative for chest pain and palpitations.  Gastrointestinal: Positive for nausea and abdominal pain. Negative for heartburn, vomiting, diarrhea, constipation, blood in stool and melena.  Musculoskeletal: Negative for myalgias and back pain.   Filed Vitals:   03/24/13 1554  BP: 106/64  Pulse: 83  Temp: 98.4 F (36.9 C)  Resp: 10   Physical Exam  Vitals reviewed. Constitutional: She is oriented to person, place, and time and well-developed, well-nourished, and in no distress.  HENT:  Head: Normocephalic and atraumatic.  Eyes: Conjunctivae are normal.  Neck: Neck supple.  Cardiovascular: Normal rate, regular rhythm and normal heart sounds.   Pulmonary/Chest: Effort normal and breath sounds normal.  Abdominal: Soft. Bowel sounds are normal. She exhibits no distension and no mass. There is no rebound and no guarding.  + Periumbilical and RLQ tenderness to palpation.  Positive obturator sign.  Negative Rosving sign  Lymphadenopathy:    She has no cervical adenopathy.  Neurological: She is alert and oriented to person, place, and time.  Skin: Skin is warm and dry. No rash noted.   Assessment/Plan: Abdominal pain, acute, right lower quadrant CBC, BMET, ESR.  Recent pelvic US negative for adnexal or uterine abnormalities.  Will obtain CT ABD/PELVIS.  Pain could be attributable to recent rupture of ovarian cyst -- can cause irritation of other visceral structures.  Ibuprofen as needed for pain.  Will treat patient according to results.  Patient instructed to present to ED if symptoms acutely worsen.

## 2013-03-25 ENCOUNTER — Telehealth: Payer: Self-pay | Admitting: *Deleted

## 2013-03-25 NOTE — Telephone Encounter (Signed)
Office Message [address] [place], [place] [address] p. [phone] f. [phone] To: Lorrin Mais (After Hours Triage) Fax: [phone] From: [facility] Date/ Time: 03/24/2013 8:21 PM Taken By: Forbes Cellar, CSR Caller: Shanda Bumps Facility: [facility] Patient: [name], [name] DOB: [birth year] Phone: [phone] Reason for Call: Shanda Bumps is calling from [place] regarding a CBC with Diff, BMP, Sed Rate ordered on [name], [name] by Martin9/16/2014 5:18:00 PM. The results were all within normal range.

## 2013-10-22 ENCOUNTER — Ambulatory Visit: Payer: 59 | Admitting: Family Medicine

## 2013-11-19 ENCOUNTER — Encounter: Payer: Self-pay | Admitting: Family Medicine

## 2013-11-19 ENCOUNTER — Ambulatory Visit (INDEPENDENT_AMBULATORY_CARE_PROVIDER_SITE_OTHER)
Admission: RE | Admit: 2013-11-19 | Discharge: 2013-11-19 | Disposition: A | Discharge status: Home / Self Care | Payer: 59 | Source: Ambulatory Visit | Attending: Family Medicine | Admitting: Family Medicine

## 2013-11-19 ENCOUNTER — Other Ambulatory Visit (INDEPENDENT_AMBULATORY_CARE_PROVIDER_SITE_OTHER): Payer: 59

## 2013-11-19 ENCOUNTER — Ambulatory Visit (INDEPENDENT_AMBULATORY_CARE_PROVIDER_SITE_OTHER): Payer: 59 | Admitting: Family Medicine

## 2013-11-19 VITALS — BP 94/62 | HR 80

## 2013-11-19 DIAGNOSIS — M79671 Pain in right foot: Secondary | ICD-10-CM

## 2013-11-19 DIAGNOSIS — M79609 Pain in unspecified limb: Secondary | ICD-10-CM

## 2013-11-19 DIAGNOSIS — S92309A Fracture of unspecified metatarsal bone(s), unspecified foot, initial encounter for closed fracture: Secondary | ICD-10-CM

## 2013-11-19 DIAGNOSIS — S92341A Displaced fracture of fourth metatarsal bone, right foot, initial encounter for closed fracture: Secondary | ICD-10-CM

## 2013-11-19 MED ORDER — TRAMADOL HCL 50 MG PO TABS: 50.0000 mg | ORAL_TABLET | Freq: Three times a day (TID) | ORAL | Status: DC | PRN

## 2013-11-19 NOTE — Progress Notes (Signed)
[name] [place], [place] [address] Phone: [phone] Subjective:     CC: Right foot pain  [id number] [name] is a 43 y.o. female coming in with complaint of right foot pain. Patient last night was trying to get out of her daughter's bed and tripped. Initially she had significant pain on the anterior lateral aspect of foot and was unable to bear weight. Patient will this morning and had significant swelling. Patient is still unable to walk secondary to the amount of pain. No history of injury before. Describes it as a dull aching pain with a sharp pain with any type of weightbearing. States that the swelling has gotten somewhat worse over the course of time. Denies any numbness. Denies any ankle pain. Rates the pain is 9/10 in severity.     Past medical history, social, surgical and family history all reviewed in electronic medical record.   Review of Systems: No headache, visual changes, nausea, vomiting, diarrhea, constipation, dizziness, abdominal pain, skin rash, fevers, chills, night sweats, weight loss, swollen lymph nodes, body aches, joint swelling, muscle aches, chest pain, shortness of breath, mood changes.   Objective Blood pressure 94/62, pulse 80, SpO2 99.00%.  General: No apparent distress alert and oriented x3 mood and affect normal, dressed appropriately.  HEENT: Pupils equal, extraocular movements intact  Respiratory: Patient's speak in full sentences and does not appear short of breath  Cardiovascular: No lower extremity edema, non tender, no erythema  Skin: Warm dry intact with no signs of infection or rash on extremities or on axial skeleton.  Abdomen: Soft nontender  Neuro: Cranial nerves II through XII are intact, neurovascularly intact in all extremities with 2+ DTRs and 2+ pulses.  Lymph: No lymphadenopathy of posterior or anterior cervical chain or axillae bilaterally.  Gait normal with good balance and  coordination.  MSK:  Non tender with full range of motion and good stability and symmetric strength and tone of shoulders, elbows, wrist, hip, knees bilaterally.  Ankle: Patient does have significant swelling over the anterior lateral aspect of the foot on the dorsal aspect. Range of motion is full in all directions. Strength is 5/5 in all directions. Stable lateral and medial ligaments; squeeze test and kleiger test unremarkable; Talar dome mildly tender No pain at base of 5th MT; No tenderness over cuboid; No tenderness over N spot or navicular prominence No tenderness on posterior aspects of lateral and medial malleolus No sign of peroneal tendon subluxations or tenderness to palpation Severely tender over the ATFL as well as the base of the fourth  metatarsal once again no pain over the fifth metatarsal. Negative tarsal tunnel tinel's Unable to walk for steps.  MSK US performed of: Right ankle This study was ordered, performed, and interpreted by Charlann Boxer D.O.  Foot/Ankle:   All structures visualized.   Talar dome unremarkable  Ankle mortise without effusion. Peroneus longus and brevis tendons unremarkable on long and transverse views without sheath effusions. Posterior tibialis, flexor hallucis longus, and flexor digitorum longus tendons unremarkable on long and transverse views without sheath effusions. Achilles tendon visualized along length of tendon and unremarkable on long and transverse views without sheath effusion. Anterior Talofibular Ligament does have significant hypoechoic changes and does have what appears to be an avulsion from the ligament. and Calcaneofibular Ligaments unremarkable and intact. Deltoid Ligament unremarkable and intact. Plantar fascia intact and without effusion, normal thickness. No increased doppler signal, cap sign, or thickening of tibial cortex. Patient  also has what appears cortical defect at the base of the fourth metatarsal that is  extra-articular and nondisplaced a very small overall. Significant inflammation is noted.  IMPRESSION: Nondisplaced fracture of the fourth metatarsal and ATFL injury grade 2 ankle sprain.      Impression and Recommendations:     This case required medical decision making of moderate complexity.

## 2013-11-19 NOTE — Assessment & Plan Note (Signed)
The patient hasn't likely to be a very small nondisplaced fracture of the fourth metatarsal with the amount of swelling in this area as well as injury to the ATFL. Patient will get x-rays which are still pending. We discussed ice protocol as well as pain medications are given per orders. Patient was put in the [facility] there was fitted by me today 2. In addition this patient will be nonweightbearing for the next week and then able to toe-touch as tolerated until patient sees me again in 2 weeks for further evaluation. We'll repeat imaging at that time and advanced appropriately.

## 2013-11-19 NOTE — Patient Instructions (Signed)
Good to meet you Ice bath 20 minutes 2 times a day Avoid weight bearing then for 5 days Only walk in boot Tramadol as needed for pain Can add tylenol to the tramadol Come see me again in 2-3 weeks. Get xrays before you see me

## 2013-12-03 ENCOUNTER — Other Ambulatory Visit: Payer: 59

## 2013-12-03 ENCOUNTER — Ambulatory Visit (INDEPENDENT_AMBULATORY_CARE_PROVIDER_SITE_OTHER): Payer: 59 | Admitting: Family Medicine

## 2013-12-03 ENCOUNTER — Encounter: Payer: Self-pay | Admitting: Family Medicine

## 2013-12-03 VITALS — BP 94/64 | HR 71 | Ht 69.0 in | Wt 140.0 lb

## 2013-12-03 DIAGNOSIS — S92309A Fracture of unspecified metatarsal bone(s), unspecified foot, initial encounter for closed fracture: Secondary | ICD-10-CM

## 2013-12-03 DIAGNOSIS — S92341A Displaced fracture of fourth metatarsal bone, right foot, initial encounter for closed fracture: Secondary | ICD-10-CM

## 2013-12-03 NOTE — Progress Notes (Signed)
[name] [place], [place] [address] Phone: [phone] Subjective:     CC: Right foot pain follow up  [id number] [name] is a 43 y.o. female coming in with complaint of right foot pain followup. Patient's had a diagnosis of a fracture the fourth metatarsal as well as a grade 2 ankle sprain. Patient has been in a [employer] [facility] for the last week and states that the pain has improved significantly. Patient is stating that she does not have any pain as long as she wears the boot. Patient does walk without the boot some mild pain. Overall the very happy. No nighttime awakening.     Past medical history, social, surgical and family history all reviewed in electronic medical record.   Review of Systems: No headache, visual changes, nausea, vomiting, diarrhea, constipation, dizziness, abdominal pain, skin rash, fevers, chills, night sweats, weight loss, swollen lymph nodes, body aches, joint swelling, muscle aches, chest pain, shortness of breath, mood changes.   Objective Blood pressure 94/64, pulse 71, height 5' 9"  (1.753 m), weight 140 lb (63.504 kg), last menstrual period 10/29/2013, SpO2 98.00%.  General: No apparent distress alert and oriented x3 mood and affect normal, dressed appropriately.  HEENT: Pupils equal, extraocular movements intact  Respiratory: Patient's speak in full sentences and does not appear short of breath  Cardiovascular: No lower extremity edema, non tender, no erythema  Skin: Warm dry intact with no signs of infection or rash on extremities or on axial skeleton.  Abdomen: Soft nontender  Neuro: Cranial nerves II through XII are intact, neurovascularly intact in all extremities with 2+ DTRs and 2+ pulses.  Lymph: No lymphadenopathy of posterior or anterior cervical chain or axillae bilaterally.  Gait normal with good balance and coordination.  MSK:  Non tender with full range of motion and good stability and  symmetric strength and tone of shoulders, elbows, wrist, hip, knees bilaterally.  Ankle: Patient still have dorsal soft tissue swelling of the foot but significant less than previously. Residual bruising still left.  Range of motion is full in all directions. Strength is 5/5 in all directions. Stable lateral and medial ligaments; squeeze test and kleiger test unremarkable; Talar dome mildly tender No pain at base of 5th MT; No tenderness over cuboid; No tenderness over N spot or navicular prominence No tenderness on posterior aspects of lateral and medial malleolus No sign of peroneal tendon subluxations or tenderness to palpation Severely tender over the ATFL as well as the base of the fourth  metatarsal once again no pain over the fifth metatarsal. Negative tarsal tunnel tinel's Unable to walk for steps.  MSK US performed of: Right ankle This study was ordered, performed, and interpreted by Charlann Boxer D.O.  Foot/Ankle:   All structures visualized.   Talar dome unremarkable  Ankle mortise without effusion. Peroneus longus and brevis tendons unremarkable on long and transverse views without sheath effusions. Posterior tibialis, flexor hallucis longus, and flexor digitorum longus tendons unremarkable on long and transverse views without sheath effusions. Achilles tendon visualized along length of tendon and unremarkable on long and transverse views without sheath effusion. Anterior Talofibular Ligament show significant hypoechoic changes surrounding it still with significant soft tissue hypoechoic changes.Marland Kitchen and Calcaneofibular Ligaments unremarkable and intact. Deltoid Ligament unremarkable and intact. Plantar fascia intact and without effusion, normal thickness. No increased doppler signal, cap sign, or thickening of tibial cortex. Patient also has what appears cortical defect at the base of the fourth metatarsal that  is extra-articular and nondisplaced with good callus formation  forming.  IMPRESSION: Healing Nondisplaced fracture of the fourth metatarsal continued soft tissue swelling      Impression and Recommendations:     This case required medical decision making of moderate complexity.

## 2013-12-03 NOTE — Assessment & Plan Note (Signed)
Patient is healing very well. Patient does have good callus formation on ultrasound today. Patient is unaware the Cam Walker for another week. In the patient and condition shoes as tolerated. Patient will continue the icing. Patient was given a home exercise program for her ankle sprain. Patient will followup again in 3 weeks for further evaluation. Patient is doing very well at that time we can release her. Patient is having any pain at that time we may need to consider repeat x-rays.  Spent greater than 25 minutes with patient face-to-face and had greater than 50% of counseling including as described above in assessment and plan.

## 2013-12-03 NOTE — Patient Instructions (Signed)
Very good to see you Ice still until swelling.  Boot for one more week.  Exercises for the ankle starting after the weekend.  Come back again in 3 weeks.

## 2013-12-07 LAB — HM MAMMOGRAPHY
HM Mammogram: NORMAL
HM Mammogram: NORMAL

## 2013-12-07 LAB — HM PAP SMEAR: HM Pap smear: NORMAL

## 2013-12-24 ENCOUNTER — Ambulatory Visit: Payer: 59 | Admitting: Family Medicine

## 2013-12-30 ENCOUNTER — Other Ambulatory Visit: Payer: Self-pay | Admitting: Dermatology

## 2014-01-28 ENCOUNTER — Other Ambulatory Visit: Payer: Self-pay | Admitting: Gastroenterology

## 2014-01-28 DIAGNOSIS — R1084 Generalized abdominal pain: Secondary | ICD-10-CM

## 2014-02-03 ENCOUNTER — Ambulatory Visit
Admission: RE | Admit: 2014-02-03 | Discharge: 2014-02-03 | Disposition: A | Discharge status: Home / Self Care | Payer: 59 | Source: Ambulatory Visit | Attending: Gastroenterology | Admitting: Gastroenterology

## 2014-02-03 DIAGNOSIS — R1084 Generalized abdominal pain: Secondary | ICD-10-CM

## 2014-02-05 ENCOUNTER — Other Ambulatory Visit: Payer: 59

## 2014-02-22 ENCOUNTER — Encounter: Payer: Self-pay | Admitting: Family Medicine

## 2014-02-22 ENCOUNTER — Ambulatory Visit (INDEPENDENT_AMBULATORY_CARE_PROVIDER_SITE_OTHER): Payer: 59 | Admitting: Family Medicine

## 2014-02-22 VITALS — BP 104/68 | HR 79 | Temp 98.2°F | Ht 69.0 in | Wt 142.0 lb

## 2014-02-22 DIAGNOSIS — M25552 Pain in left hip: Secondary | ICD-10-CM

## 2014-02-22 DIAGNOSIS — Z0000 Encounter for general adult medical examination without abnormal findings: Secondary | ICD-10-CM

## 2014-02-22 DIAGNOSIS — M25551 Pain in right hip: Secondary | ICD-10-CM

## 2014-02-22 DIAGNOSIS — M25559 Pain in unspecified hip: Secondary | ICD-10-CM

## 2014-02-22 DIAGNOSIS — R52 Pain, unspecified: Secondary | ICD-10-CM

## 2014-02-22 DIAGNOSIS — R109 Unspecified abdominal pain: Secondary | ICD-10-CM

## 2014-02-22 DIAGNOSIS — R1031 Right lower quadrant pain: Secondary | ICD-10-CM

## 2014-02-22 NOTE — Progress Notes (Signed)
Pre visit review using our clinic review tool, if applicable. No additional management support is needed unless otherwise documented below in the visit note. 

## 2014-02-22 NOTE — Patient Instructions (Signed)
Digestive Advantage probiotics, vitamin D 2000 IU daily, Fatty acid supplement Krill oil, MegaRed daily   Preventive Care for Adults A healthy lifestyle and preventive care can promote health and wellness. Preventive health guidelines for women include the following key practices.  A routine yearly physical is a good way to check with your health care provider about your health and preventive screening. It is a chance to share any concerns and updates on your health and to receive a thorough exam.  Visit your dentist for a routine exam and preventive care every 6 months. Brush your teeth twice a day and floss once a day. Good oral hygiene prevents tooth decay and gum disease.  The frequency of eye exams is based on your age, health, family medical history, use of contact lenses, and other factors. Follow your health care provider's recommendations for frequency of eye exams.  Eat a healthy diet. Foods like vegetables, fruits, whole grains, low-fat dairy products, and lean protein foods contain the nutrients you need without too many calories. Decrease your intake of foods high in solid fats, added sugars, and salt. Eat the right amount of calories for you.Get information about a proper diet from your health care provider, if necessary.  Regular physical exercise is one of the most important things you can do for your health. Most adults should get at least 150 minutes of moderate-intensity exercise (any activity that increases your heart rate and causes you to sweat) each week. In addition, most adults need muscle-strengthening exercises on 2 or more days a week.  Maintain a healthy weight. The body mass index (BMI) is a screening tool to identify possible weight problems. It provides an estimate of body fat based on height and weight. Your health care provider can find your BMI and can help you achieve or maintain a healthy weight.For adults 20 years and older:  A BMI below 18.5 is considered  underweight.  A BMI of 18.5 to 24.9 is normal.  A BMI of 25 to 29.9 is considered overweight.  A BMI of 30 and above is considered obese.  Maintain normal blood lipids and cholesterol levels by exercising and minimizing your intake of saturated fat. Eat a balanced diet with plenty of fruit and vegetables. Blood tests for lipids and cholesterol should begin at age 64 and be repeated every 5 years. If your lipid or cholesterol levels are high, you are over 50, or you are at high risk for heart disease, you may need your cholesterol levels checked more frequently.Ongoing high lipid and cholesterol levels should be treated with medicines if diet and exercise are not working.  If you smoke, find out from your health care provider how to quit. If you do not use tobacco, do not start.  Lung cancer screening is recommended for adults aged 25-80 years who are at high risk for developing lung cancer because of a history of smoking. A yearly low-dose CT scan of the lungs is recommended for people who have at least a 30-pack-year history of smoking and are a current smoker or have quit within the past 15 years. A pack year of smoking is smoking an average of 1 pack of cigarettes a day for 1 year (for example: 1 pack a day for 30 years or 2 packs a day for 15 years). Yearly screening should continue until the smoker has stopped smoking for at least 15 years. Yearly screening should be stopped for people who develop a health problem that would prevent them from  having lung cancer treatment.  If you are pregnant, do not drink alcohol. If you are breastfeeding, be very cautious about drinking alcohol. If you are not pregnant and choose to drink alcohol, do not have more than 1 drink per day. One drink is considered to be 12 ounces (355 mL) of beer, 5 ounces (148 mL) of wine, or 1.5 ounces (44 mL) of liquor.  Avoid use of street drugs. Do not share needles with anyone. Ask for help if you need support or  instructions about stopping the use of drugs.  High blood pressure causes heart disease and increases the risk of stroke. Your blood pressure should be checked at least every 1 to 2 years. Ongoing high blood pressure should be treated with medicines if weight loss and exercise do not work.  If you are 45-46 years old, ask your health care provider if you should take aspirin to prevent strokes.  Diabetes screening involves taking a blood sample to check your fasting blood sugar level. This should be done once every 3 years, after age 54, if you are within normal weight and without risk factors for diabetes. Testing should be considered at a younger age or be carried out more frequently if you are overweight and have at least 1 risk factor for diabetes.  Breast cancer screening is essential preventive care for women. You should practice "breast self-awareness." This means understanding the normal appearance and feel of your breasts and may include breast self-examination. Any changes detected, no matter how small, should be reported to a health care provider. Women in their 75s and 30s should have a clinical breast exam (CBE) by a health care provider as part of a regular health exam every 1 to 3 years. After age 69, women should have a CBE every year. Starting at age 62, women should consider having a mammogram (breast X-ray test) every year. Women who have a family history of breast cancer should talk to their health care provider about genetic screening. Women at a high risk of breast cancer should talk to their health care providers about having an MRI and a mammogram every year.  Breast cancer gene (BRCA)-related cancer risk assessment is recommended for women who have family members with BRCA-related cancers. BRCA-related cancers include breast, ovarian, tubal, and peritoneal cancers. Having family members with these cancers may be associated with an increased risk for harmful changes (mutations) in  the breast cancer genes BRCA1 and BRCA2. Results of the assessment will determine the need for genetic counseling and BRCA1 and BRCA2 testing.  Routine pelvic exams to screen for cancer are no longer recommended for nonpregnant women who are considered low risk for cancer of the pelvic organs (ovaries, uterus, and vagina) and who do not have symptoms. Ask your health care provider if a screening pelvic exam is right for you.  If you have had past treatment for cervical cancer or a condition that could lead to cancer, you need Pap tests and screening for cancer for at least 20 years after your treatment. If Pap tests have been discontinued, your risk factors (such as having a new sexual partner) need to be reassessed to determine if screening should be resumed. Some women have medical problems that increase the chance of getting cervical cancer. In these cases, your health care provider may recommend more frequent screening and Pap tests.  The HPV test is an additional test that may be used for cervical cancer screening. The HPV test looks for the virus that  can cause the cell changes on the cervix. The cells collected during the Pap test can be tested for HPV. The HPV test could be used to screen women aged 48 years and older, and should be used in women of any age who have unclear Pap test results. After the age of 71, women should have HPV testing at the same frequency as a Pap test.  Colorectal cancer can be detected and often prevented. Most routine colorectal cancer screening begins at the age of 46 years and continues through age 1 years. However, your health care provider may recommend screening at an earlier age if you have risk factors for colon cancer. On a yearly basis, your health care provider may provide home test kits to check for hidden blood in the stool. Use of a small camera at the end of a tube, to directly examine the colon (sigmoidoscopy or colonoscopy), can detect the earliest forms  of colorectal cancer. Talk to your health care provider about this at age 73, when routine screening begins. Direct exam of the colon should be repeated every 5-10 years through age 47 years, unless early forms of pre-cancerous polyps or small growths are found.  People who are at an increased risk for hepatitis B should be screened for this virus. You are considered at high risk for hepatitis B if:  You were born in a country where hepatitis B occurs often. Talk with your health care provider about which countries are considered high risk.  Your parents were born in a high-risk country and you have not received a shot to protect against hepatitis B (hepatitis B vaccine).  You have HIV or AIDS.  You use needles to inject street drugs.  You live with, or have sex with, someone who has hepatitis B.  You get hemodialysis treatment.  You take certain medicines for conditions like cancer, organ transplantation, and autoimmune conditions.  Hepatitis C blood testing is recommended for all people born from [birth year] through 1965 and any individual with known risks for hepatitis C.  Practice safe sex. Use condoms and avoid high-risk sexual practices to reduce the spread of sexually transmitted infections (STIs). STIs include gonorrhea, chlamydia, syphilis, trichomonas, herpes, HPV, and human immunodeficiency virus (HIV). Herpes, HIV, and HPV are viral illnesses that have no cure. They can result in disability, cancer, and death.  You should be screened for sexually transmitted illnesses (STIs) including gonorrhea and chlamydia if:  You are sexually active and are younger than 24 years.  You are older than 24 years and your health care provider tells you that you are at risk for this type of infection.  Your sexual activity has changed since you were last screened and you are at an increased risk for chlamydia or gonorrhea. Ask your health care provider if you are at risk.  If you are at risk of  being infected with HIV, it is recommended that you take a prescription medicine daily to prevent HIV infection. This is called preexposure prophylaxis (PrEP). You are considered at risk if:  You are a heterosexual woman, are sexually active, and are at increased risk for HIV infection.  You take drugs by injection.  You are sexually active with a partner who has HIV.  Talk with your health care provider about whether you are at high risk of being infected with HIV. If you choose to begin PrEP, you should first be tested for HIV. You should then be tested every 3 months for as long as  you are taking PrEP.  Osteoporosis is a disease in which the bones lose minerals and strength with aging. This can result in serious bone fractures or breaks. The risk of osteoporosis can be identified using a bone density scan. Women ages 64 years and over and women at risk for fractures or osteoporosis should discuss screening with their health care providers. Ask your health care provider whether you should take a calcium supplement or vitamin D to reduce the rate of osteoporosis.  Menopause can be associated with physical symptoms and risks. Hormone replacement therapy is available to decrease symptoms and risks. You should talk to your health care provider about whether hormone replacement therapy is right for you.  Use sunscreen. Apply sunscreen liberally and repeatedly throughout the day. You should seek shade when your shadow is shorter than you. Protect yourself by wearing long sleeves, pants, a wide-brimmed hat, and sunglasses year round, whenever you are outdoors.  Once a month, do a whole body skin exam, using a mirror to look at the skin on your back. Tell your health care provider of new moles, moles that have irregular borders, moles that are larger than a pencil eraser, or moles that have changed in shape or color.  Stay current with required vaccines (immunizations).  Influenza vaccine. All adults  should be immunized every year.  Tetanus, diphtheria, and acellular pertussis (Td, Tdap) vaccine. Pregnant women should receive 1 dose of Tdap vaccine during each pregnancy. The dose should be obtained regardless of the length of time since the last dose. Immunization is preferred during the 27th-36th week of gestation. An adult who has not previously received Tdap or who does not know her vaccine status should receive 1 dose of Tdap. This initial dose should be followed by tetanus and diphtheria toxoids (Td) booster doses every 10 years. Adults with an unknown or incomplete history of completing a 3-dose immunization series with Td-containing vaccines should begin or complete a primary immunization series including a Tdap dose. Adults should receive a Td booster every 10 years.  Varicella vaccine. An adult without evidence of immunity to varicella should receive 2 doses or a second dose if she has previously received 1 dose. Pregnant females who do not have evidence of immunity should receive the first dose after pregnancy. This first dose should be obtained before leaving the health care facility. The second dose should be obtained 4-8 weeks after the first dose.  Human papillomavirus (HPV) vaccine. Females aged 13-26 years who have not received the vaccine previously should obtain the 3-dose series. The vaccine is not recommended for use in pregnant females. However, pregnancy testing is not needed before receiving a dose. If a female is found to be pregnant after receiving a dose, no treatment is needed. In that case, the remaining doses should be delayed until after the pregnancy. Immunization is recommended for any person with an immunocompromised condition through the age of 39 years if she did not get any or all doses earlier. During the 3-dose series, the second dose should be obtained 4-8 weeks after the first dose. The third dose should be obtained 24 weeks after the first dose and 16 weeks after  the second dose.  Zoster vaccine. One dose is recommended for adults aged 91 years or older unless certain conditions are present.  Measles, mumps, and rubella (MMR) vaccine. Adults born before [birth year] generally are considered immune to measles and mumps. Adults born in [birth year] or later should have 1 or more doses of MMR  vaccine unless there is a contraindication to the vaccine or there is laboratory evidence of immunity to each of the three diseases. A routine second dose of MMR vaccine should be obtained at least 28 days after the first dose for students attending postsecondary schools, health care workers, or international travelers. People who received inactivated measles vaccine or an unknown type of measles vaccine during 1963-1967 should receive 2 doses of MMR vaccine. People who received inactivated mumps vaccine or an unknown type of mumps vaccine before 1979 and are at high risk for mumps infection should consider immunization with 2 doses of MMR vaccine. For females of childbearing age, rubella immunity should be determined. If there is no evidence of immunity, females who are not pregnant should be vaccinated. If there is no evidence of immunity, females who are pregnant should delay immunization until after pregnancy. Unvaccinated health care workers born before [birth year] who lack laboratory evidence of measles, mumps, or rubella immunity or laboratory confirmation of disease should consider measles and mumps immunization with 2 doses of MMR vaccine or rubella immunization with 1 dose of MMR vaccine.  Pneumococcal 13-valent conjugate (PCV13) vaccine. When indicated, a person who is uncertain of her immunization history and has no record of immunization should receive the PCV13 vaccine. An adult aged 28 years or older who has certain medical conditions and has not been previously immunized should receive 1 dose of PCV13 vaccine. This PCV13 should be followed with a dose of pneumococcal polysaccharide (PPSV23)  vaccine. The PPSV23 vaccine dose should be obtained at least 8 weeks after the dose of PCV13 vaccine. An adult aged 47 years or older who has certain medical conditions and previously received 1 or more doses of PPSV23 vaccine should receive 1 dose of PCV13. The PCV13 vaccine dose should be obtained 1 or more years after the last PPSV23 vaccine dose.  Pneumococcal polysaccharide (PPSV23) vaccine. When PCV13 is also indicated, PCV13 should be obtained first. All adults aged 47 years and older should be immunized. An adult younger than age 83 years who has certain medical conditions should be immunized. Any person who resides in a nursing home or long-term care facility should be immunized. An adult smoker should be immunized. People with an immunocompromised condition and certain other conditions should receive both PCV13 and PPSV23 vaccines. People with human immunodeficiency virus (HIV) infection should be immunized as soon as possible after diagnosis. Immunization during chemotherapy or radiation therapy should be avoided. Routine use of PPSV23 vaccine is not recommended for American Indians, [place] Natives, or people younger than 65 years unless there are medical conditions that require PPSV23 vaccine. When indicated, people who have unknown immunization and have no record of immunization should receive PPSV23 vaccine. One-time revaccination 5 years after the first dose of PPSV23 is recommended for people aged 19-64 years who have chronic kidney failure, nephrotic syndrome, asplenia, or immunocompromised conditions. People who received 1-2 doses of PPSV23 before age 85 years should receive another dose of PPSV23 vaccine at age 57 years or later if at least 5 years have passed since the previous dose. Doses of PPSV23 are not needed for people immunized with PPSV23 at or after age 13 years.  Meningococcal vaccine. Adults with asplenia or persistent complement component deficiencies should receive 2 doses of  quadrivalent meningococcal conjugate (MenACWY-D) vaccine. The doses should be obtained at least 2 months apart. Microbiologists working with certain meningococcal bacteria, [place] recruits, people at risk during an outbreak, and people who travel to or live in  countries with a high rate of meningitis should be immunized. A first-year college student up through age 18 years who is living in a residence hall should receive a dose if she did not receive a dose on or after her 16th birthday. Adults who have certain high-risk conditions should receive one or more doses of vaccine.  Hepatitis A vaccine. Adults who wish to be protected from this disease, have certain high-risk conditions, work with hepatitis A-infected animals, work in hepatitis A research labs, or travel to or work in countries with a high rate of hepatitis A should be immunized. Adults who were previously unvaccinated and who anticipate close contact with an international adoptee during the first 60 days after arrival in the [place] from a country with a high rate of hepatitis A should be immunized.  Hepatitis B vaccine. Adults who wish to be protected from this disease, have certain high-risk conditions, may be exposed to blood or other infectious body fluids, are household contacts or sex partners of hepatitis B positive people, are clients or workers in certain care facilities, or travel to or work in countries with a high rate of hepatitis B should be immunized.  Haemophilus influenzae type b (Hib) vaccine. A previously unvaccinated person with asplenia or sickle cell disease or having a scheduled splenectomy should receive 1 dose of Hib vaccine. Regardless of previous immunization, a recipient of a hematopoietic stem cell transplant should receive a 3-dose series 6-12 months after her successful transplant. Hib vaccine is not recommended for adults with HIV infection. Preventive Services / Frequency Ages 75 to 75 years  Blood  pressure check.** / Every 1 to 2 years.  Lipid and cholesterol check.** / Every 5 years beginning at age 51.  Clinical breast exam.** / Every 3 years for women in their 54s and 36s.  BRCA-related cancer risk assessment.** / For women who have family members with a BRCA-related cancer (breast, ovarian, tubal, or peritoneal cancers).  Pap test.** / Every 2 years from ages 54 through 58. Every 3 years starting at age 54 through age 1 or 69 with a history of 3 consecutive normal Pap tests.  HPV screening.** / Every 3 years from ages 58 through ages 65 to 81 with a history of 3 consecutive normal Pap tests.  Hepatitis C blood test.** / For any individual with known risks for hepatitis C.  Skin self-exam. / Monthly.  Influenza vaccine. / Every year.  Tetanus, diphtheria, and acellular pertussis (Tdap, Td) vaccine.** / Consult your health care provider. Pregnant women should receive 1 dose of Tdap vaccine during each pregnancy. 1 dose of Td every 10 years.  Varicella vaccine.** / Consult your health care provider. Pregnant females who do not have evidence of immunity should receive the first dose after pregnancy.  HPV vaccine. / 3 doses over 6 months, if 44 and younger. The vaccine is not recommended for use in pregnant females. However, pregnancy testing is not needed before receiving a dose.  Measles, mumps, rubella (MMR) vaccine.** / You need at least 1 dose of MMR if you were born in 1957 or later. You may also need a 2nd dose. For females of childbearing age, rubella immunity should be determined. If there is no evidence of immunity, females who are not pregnant should be vaccinated. If there is no evidence of immunity, females who are pregnant should delay immunization until after pregnancy.  Pneumococcal 13-valent conjugate (PCV13) vaccine.** / Consult your health care provider.  Pneumococcal polysaccharide (PPSV23) vaccine.** /  1 to 2 doses if you smoke cigarettes or if you have certain  conditions.  Meningococcal vaccine.** / 1 dose if you are age 80 to 63 years and a Market researcher living in a residence hall, or have one of several medical conditions, you need to get vaccinated against meningococcal disease. You may also need additional booster doses.  Hepatitis A vaccine.** / Consult your health care provider.  Hepatitis B vaccine.** / Consult your health care provider.  Haemophilus influenzae type b (Hib) vaccine.** / Consult your health care provider. Ages 77 to 92 years  Blood pressure check.** / Every 1 to 2 years.  Lipid and cholesterol check.** / Every 5 years beginning at age 65 years.  Lung cancer screening. / Every year if you are aged 50-80 years and have a 30-pack-year history of smoking and currently smoke or have quit within the past 15 years. Yearly screening is stopped once you have quit smoking for at least 15 years or develop a health problem that would prevent you from having lung cancer treatment.  Clinical breast exam.** / Every year after age 2 years.  BRCA-related cancer risk assessment.** / For women who have family members with a BRCA-related cancer (breast, ovarian, tubal, or peritoneal cancers).  Mammogram.** / Every year beginning at age 85 years and continuing for as long as you are in good health. Consult with your health care provider.  Pap test.** / Every 3 years starting at age 55 years through age 41 or 79 years with a history of 3 consecutive normal Pap tests.  HPV screening.** / Every 3 years from ages 75 years through ages 53 to 77 years with a history of 3 consecutive normal Pap tests.  Fecal occult blood test (FOBT) of stool. / Every year beginning at age 59 years and continuing until age 70 years. You may not need to do this test if you get a colonoscopy every 10 years.  Flexible sigmoidoscopy or colonoscopy.** / Every 5 years for a flexible sigmoidoscopy or every 10 years for a colonoscopy beginning at age 97 years  and continuing until age 67 years.  Hepatitis C blood test.** / For all people born from [birth year] through 1965 and any individual with known risks for hepatitis C.  Skin self-exam. / Monthly.  Influenza vaccine. / Every year.  Tetanus, diphtheria, and acellular pertussis (Tdap/Td) vaccine.** / Consult your health care provider. Pregnant women should receive 1 dose of Tdap vaccine during each pregnancy. 1 dose of Td every 10 years.  Varicella vaccine.** / Consult your health care provider. Pregnant females who do not have evidence of immunity should receive the first dose after pregnancy.  Zoster vaccine.** / 1 dose for adults aged 72 years or older.  Measles, mumps, rubella (MMR) vaccine.** / You need at least 1 dose of MMR if you were born in 1957 or later. You may also need a 2nd dose. For females of childbearing age, rubella immunity should be determined. If there is no evidence of immunity, females who are not pregnant should be vaccinated. If there is no evidence of immunity, females who are pregnant should delay immunization until after pregnancy.  Pneumococcal 13-valent conjugate (PCV13) vaccine.** / Consult your health care provider.  Pneumococcal polysaccharide (PPSV23) vaccine.** / 1 to 2 doses if you smoke cigarettes or if you have certain conditions.  Meningococcal vaccine.** / Consult your health care provider.  Hepatitis A vaccine.** / Consult your health care provider.  Hepatitis B vaccine.** / Consult your  health care provider.  Haemophilus influenzae type b (Hib) vaccine.** / Consult your health care provider. Ages 75 years and over  Blood pressure check.** / Every 1 to 2 years.  Lipid and cholesterol check.** / Every 5 years beginning at age 45 years.  Lung cancer screening. / Every year if you are aged 66-80 years and have a 30-pack-year history of smoking and currently smoke or have quit within the past 15 years. Yearly screening is stopped once you have quit smoking  for at least 15 years or develop a health problem that would prevent you from having lung cancer treatment.  Clinical breast exam.** / Every year after age 42 years.  BRCA-related cancer risk assessment.** / For women who have family members with a BRCA-related cancer (breast, ovarian, tubal, or peritoneal cancers).  Mammogram.** / Every year beginning at age 68 years and continuing for as long as you are in good health. Consult with your health care provider.  Pap test.** / Every 3 years starting at age 4 years through age 69 or 75 years with 3 consecutive normal Pap tests. Testing can be stopped between 65 and 70 years with 3 consecutive normal Pap tests and no abnormal Pap or HPV tests in the past 10 years.  HPV screening.** / Every 3 years from ages 23 years through ages 67 or 65 years with a history of 3 consecutive normal Pap tests. Testing can be stopped between 65 and 70 years with 3 consecutive normal Pap tests and no abnormal Pap or HPV tests in the past 10 years.  Fecal occult blood test (FOBT) of stool. / Every year beginning at age 11 years and continuing until age 74 years. You may not need to do this test if you get a colonoscopy every 10 years.  Flexible sigmoidoscopy or colonoscopy.** / Every 5 years for a flexible sigmoidoscopy or every 10 years for a colonoscopy beginning at age 68 years and continuing until age 33 years.  Hepatitis C blood test.** / For all people born from [birth year] through 1963 and any individual with known risks for hepatitis C.  Osteoporosis screening.** / A one-time screening for women ages 67 years and over and women at risk for fractures or osteoporosis.  Skin self-exam. / Monthly.  Influenza vaccine. / Every year.  Tetanus, diphtheria, and acellular pertussis (Tdap/Td) vaccine.** / 1 dose of Td every 10 years.  Varicella vaccine.** / Consult your health care provider.  Zoster vaccine.** / 1 dose for adults aged 96 years or older.  Pneumococcal  13-valent conjugate (PCV13) vaccine.** / Consult your health care provider.  Pneumococcal polysaccharide (PPSV23) vaccine.** / 1 dose for all adults aged 68 years and older.  Meningococcal vaccine.** / Consult your health care provider.  Hepatitis A vaccine.** / Consult your health care provider.  Hepatitis B vaccine.** / Consult your health care provider.  Haemophilus influenzae type b (Hib) vaccine.** / Consult your health care provider. ** Family history and personal history of risk and conditions may change your health care provider's recommendations. Document Released: 08/21/2001 Document Revised: 11/09/2013 Document Reviewed: 11/20/2010 [facility] Patient Information 2015 [place], [place]. This information is not intended to replace advice given to you by your health care provider. Make sure you discuss any questions you have with your health care provider.

## 2014-02-27 DIAGNOSIS — Z0000 Encounter for general adult medical examination without abnormal findings: Secondary | ICD-10-CM | POA: Insufficient documentation

## 2014-02-27 NOTE — Progress Notes (Signed)
Patient ID: [name], female   DOB: [birth year], 43 y.o.   MRN: [id number] Akaya Proffit [id number] 07-02-1971 02/27/2014      Progress Note New Patient  Subjective  Chief Complaint  Chief Complaint  Patient presents with  . Establish Care    new patient    HPI  Patient is a 43 year old female in today for routine medical care. Here today to establish care. Has been struggling with intermittent RLQ pain for about a year. Over past several months it has become for constant. No change in bowel or bladder habits. Recent work up with CT scan, gastroenterology and gynecology unremarkable. Improved some at this time. Has a 15 year history of left hip pain which has responded to PT and massage therapy in the past. No other new or acute concerns. Denies CP/palp/SOB/HA/congestion/fevers/GI or GU c/o. Taking meds as prescribed  Past Medical History  Diagnosis Date  . Chest pain   . Hip pain   . History of chicken pox     childhood  . Ovarian cyst 8/14    did rupture   . Allergy     seasonal    Past Surgical History  Procedure Laterality Date  . Knee surgery  43 yrs old    right  . Wisdom tooth extraction  43 yrs old    Family History  Problem Relation Age of Onset  . Heart attack Father   . Alcohol abuse Father     recovered  . Healthy Sister   . Cancer Maternal Grandmother     breast  . Prostate cancer Neg Hx   . Colon cancer Neg Hx   . Healthy Brother     1 of 1  . Supraventricular tachycardia Sister   . Stroke Mother     mini  . Tourette syndrome Son   . Alcohol abuse Paternal Grandmother   . Alcohol abuse Paternal Grandfather     History   Social History  . Marital Status: Married    Spouse Name: N/A    Number of Children: N/A  . Years of Education: N/A   Occupational History  . Not on file.   Social History Main Topics  . Smoking status: Never Smoker   . Smokeless tobacco: Never Used  . Alcohol Use: No  . Drug Use: No  . Sexual Activity:  Yes    Partners: Male     Comment: lives with children and husband, no dietary restrictions   Other Topics Concern  . Not on file   Social History Narrative  . No narrative on file    Current Outpatient Prescriptions on File Prior to Visit  Medication Sig Dispense Refill  . Calcium Carbonate-Vitamin D (CALCIUM + D PO) Take 1 tablet by mouth daily.      . cetirizine (ZYRTEC) 10 MG tablet Take 10 mg by mouth as needed.       . Coenzyme Q10 (COQ10 PO) Take 1 tablet by mouth daily.      . Nutritional Supplements (JUICE PLUS FIBRE PO) Take 4 tablets by mouth daily.       No current facility-administered medications on file prior to visit.    No Known Allergies  Review of Systems  Review of Systems  Constitutional: Negative.  Negative for fever and malaise/fatigue.  HENT: Negative for congestion.   Eyes: Negative for discharge.  Respiratory: Negative for shortness of breath.   Cardiovascular: Negative for chest pain, palpitations and leg swelling.  Gastrointestinal: Positive for  abdominal pain. Negative for nausea and diarrhea.  Genitourinary: Negative for dysuria.  Musculoskeletal: Positive for joint pain. Negative for falls.  Skin: Negative for rash.  Neurological: Negative for loss of consciousness and headaches.  Endo/Heme/Allergies: Negative for polydipsia.  Psychiatric/Behavioral: Negative for depression and suicidal ideas. The patient is not nervous/anxious and does not have insomnia.     Objective  BP 104/68  Pulse 79  Temp(Src) 98.2 F (36.8 C) (Oral)  Ht 5' 9"  (1.753 m)  Wt 142 lb (64.411 kg)  BMI 20.96 kg/m2  SpO2 98%  LMP 02/01/2014  Physical Exam  Physical Exam  Constitutional: She is oriented to person, place, and time and well-developed, well-nourished, and in no distress. No distress.  HENT:  Head: Normocephalic and atraumatic.  Right Ear: External ear normal.  Left Ear: External ear normal.  Nose: Nose normal.  Mouth/Throat: Oropharynx is clear  and moist. No oropharyngeal exudate.  Eyes: Conjunctivae are normal. Pupils are equal, round, and reactive to light. Right eye exhibits no discharge. Left eye exhibits no discharge. No scleral icterus.  Neck: Normal range of motion. Neck supple. No thyromegaly present.  Cardiovascular: Normal rate, regular rhythm, normal heart sounds and intact distal pulses.   No murmur heard. Pulmonary/Chest: Effort normal and breath sounds normal. No respiratory distress. She has no wheezes. She has no rales.  Abdominal: Soft. Bowel sounds are normal. She exhibits no distension and no mass. There is no tenderness.  Musculoskeletal: Normal range of motion. She exhibits no edema and no tenderness.  Lymphadenopathy:    She has no cervical adenopathy.  Neurological: She is alert and oriented to person, place, and time. She has normal reflexes. No cranial nerve deficit. Coordination normal.  Skin: Skin is warm and dry. No rash noted. She is not diaphoretic.  Psychiatric: Mood, memory and affect normal.       Assessment & Plan  Abdominal pain, acute, right lower quadrant Recent work up with CT scan, gastroenterology and gynecology unremarkable. Improved some at this time. Suspect underlying musculoskeletal issues. Encouraged regular exercise, stretching, massage, chiropractic etc. Report if worsens  Hip pain Encouraged stretching, moist heat, PT and massage therapy. Report if worsens  Preventative health care Patient encouraged to maintain heart healthy diet, regular exercise, adequate sleep. Consider daily probiotics. Take medications as prescribed. Obtained annual labs, all normal

## 2014-02-27 NOTE — Assessment & Plan Note (Signed)
Encouraged stretching, moist heat, PT and massage therapy. Report if worsens

## 2014-02-27 NOTE — Assessment & Plan Note (Signed)
Patient encouraged to maintain heart healthy diet, regular exercise, adequate sleep. Consider daily probiotics. Take medications as prescribed. Obtained annual labs, all normal

## 2014-02-27 NOTE — Assessment & Plan Note (Signed)
Recent work up with CT scan, gastroenterology and gynecology unremarkable. Improved some at this time. Suspect underlying musculoskeletal issues. Encouraged regular exercise, stretching, massage, chiropractic etc. Report if worsens

## 2014-06-27 ENCOUNTER — Encounter (HOSPITAL_BASED_OUTPATIENT_CLINIC_OR_DEPARTMENT_OTHER): Payer: Self-pay | Admitting: *Deleted

## 2014-06-27 ENCOUNTER — Emergency Department (HOSPITAL_BASED_OUTPATIENT_CLINIC_OR_DEPARTMENT_OTHER)
Admission: EM | Admit: 2014-06-27 | Discharge: 2014-06-27 | Disposition: A | Discharge status: Home / Self Care | Payer: 59 | Attending: Emergency Medicine | Admitting: Emergency Medicine

## 2014-06-27 ENCOUNTER — Emergency Department (HOSPITAL_BASED_OUTPATIENT_CLINIC_OR_DEPARTMENT_OTHER): Payer: 59

## 2014-06-27 DIAGNOSIS — R52 Pain, unspecified: Secondary | ICD-10-CM

## 2014-06-27 DIAGNOSIS — Z8742 Personal history of other diseases of the female genital tract: Secondary | ICD-10-CM | POA: Insufficient documentation

## 2014-06-27 DIAGNOSIS — Z79899 Other long term (current) drug therapy: Secondary | ICD-10-CM | POA: Insufficient documentation

## 2014-06-27 DIAGNOSIS — Z8619 Personal history of other infectious and parasitic diseases: Secondary | ICD-10-CM | POA: Diagnosis not present

## 2014-06-27 DIAGNOSIS — M79672 Pain in left foot: Secondary | ICD-10-CM

## 2014-06-27 MED ORDER — TRAMADOL HCL 50 MG PO TABS: 50.0000 mg | ORAL_TABLET | Freq: Four times a day (QID) | ORAL | Status: DC | PRN

## 2014-06-27 NOTE — ED Notes (Signed)
Pt c.o sudden onset of left foot pain w/o injury x 1 hr ago

## 2014-06-27 NOTE — ED Provider Notes (Signed)
CSN: [id number]     Arrival date & time 06/27/14  2025 History   First MD Initiated Contact with Patient 06/27/14 2119     Chief Complaint  Patient presents with  . Foot Pain     (Consider location/radiation/quality/duration/timing/severity/associated sxs/prior Treatment) HPI   43 year old female presents complaining of sudden onset of left foot pain. Patient states approximately an hour ago she was walking normally and felt a sharp throbbing pain to the dorsum of her left foot that radiates up her leg. Pain is intense, and felt similar to a time in the past when she broke her right foot from falling off the bed. She denies any specific injury. She denies any ankle pain or hip knee pain. No fever, no rash, no recent trauma. No history of gout. She did take some ibuprofen prior to arrival that provide some relief. She denies any numbness. No prior history of PE or DVT, no recent surgery, prolonged bed rest, unilateral leg swelling or calf pain, no history of active cancer.  Past Medical History  Diagnosis Date  . Chest pain   . Hip pain   . History of chicken pox     childhood  . Ovarian cyst 8/14    did rupture   . Allergy     seasonal   Past Surgical History  Procedure Laterality Date  . Knee surgery  43 yrs old    right  . Wisdom tooth extraction  43 yrs old   Family History  Problem Relation Age of Onset  . Heart attack Father   . Alcohol abuse Father     recovered  . Healthy Sister   . Cancer Maternal Grandmother     breast  . Prostate cancer Neg Hx   . Colon cancer Neg Hx   . Healthy Brother     1 of 1  . Supraventricular tachycardia Sister   . Stroke Mother     mini  . Tourette syndrome Son   . Alcohol abuse Paternal Grandmother   . Alcohol abuse Paternal Grandfather    History  Substance Use Topics  . Smoking status: Never Smoker   . Smokeless tobacco: Never Used  . Alcohol Use: No   OB History    No data available     Review of Systems   Constitutional: Negative for fever.  Musculoskeletal: Positive for myalgias.  Skin: Negative for rash and wound.  Neurological: Negative for numbness.      Allergies  Review of patient's allergies indicates no known allergies.  Home Medications   Prior to Admission medications   Medication Sig Start Date End Date Taking? Authorizing Provider  Calcium Carbonate-Vitamin D (CALCIUM + D PO) Take 1 tablet by mouth daily.    Historical Provider, MD  cetirizine (ZYRTEC) 10 MG tablet Take 10 mg by mouth as needed.     Historical Provider, MD  Coenzyme Q10 (COQ10 PO) Take 1 tablet by mouth daily.    Historical Provider, MD  Nutritional Supplements (JUICE PLUS FIBRE PO) Take 4 tablets by mouth daily.    Historical Provider, MD   BP 105/54 mmHg  Pulse 87  Temp(Src) 97.3 F (36.3 C) (Oral)  Resp 16  Ht 5' 9"  (1.753 m)  Wt 140 lb (63.504 kg)  BMI 20.67 kg/m2  SpO2 100%  LMP 06/20/2014 Physical Exam  Constitutional: She appears well-developed and well-nourished. No distress.  HENT:  Head: Atraumatic.  Eyes: Conjunctivae are normal.  Neck: Neck supple.  Musculoskeletal: She exhibits tenderness (  left foot: Point tenderness to mid dorsum of foot on palpation without any overlying skin changes, no rash, no crepitus or gross deformity. Brisk cap refills to all toes with intact distal pulse. Increasing pain with plantar flexion but normal inversion ). She exhibits no edema.  Neurological: She is alert.  Skin: No rash noted.  Psychiatric: She has a normal mood and affect.  Nursing note and vitals reviewed.   ED Course  Procedures (including critical care time)  Acute onset of left foot pain without injury. X-ray shows no acute abnormalities. Patient is neurovascularly intact. No evidence to suggest DVT. Ace wrap provide, rice therapy discussed. Return precautions discussed. Patient is able to ambulate.  Labs Review Labs Reviewed - No data to display  Imaging Review Dg Foot Complete  Left  06/27/2014   CLINICAL DATA:  Pain at the base of the fourth and fifth fifth metatarsal.  EXAM: LEFT FOOT - COMPLETE 3+ VIEW  COMPARISON:  None.  FINDINGS: No fracture or dislocation of mid foot or forefoot. The phalanges are normal. The calcaneus is normal. No soft tissue abnormality.  IMPRESSION: No acute osseous abnormality.   Electronically Signed   By: Suzy Bouchard M.D.   On: 06/27/2014 20:50     EKG Interpretation None      MDM   Final diagnoses:  Left foot pain    BP 105/54 mmHg  Pulse 87  Temp(Src) 97.3 F (36.3 C) (Oral)  Resp 16  Ht 5' 9"  (1.753 m)  Wt 140 lb (63.504 kg)  BMI 20.67 kg/m2  SpO2 100%  LMP 06/20/2014  I have reviewed nursing notes and vital signs. I personally reviewed the imaging tests through PACS system  I reviewed available ER/hospitalization records thought the EMR     Domenic Moras, PA-C 06/27/14 2130  Malvin Johns, MD 06/27/14 2338

## 2014-06-27 NOTE — Discharge Instructions (Signed)
Elastic Bandage and RICE Elastic bandages come in different shapes and sizes. They perform different functions. Your caregiver will help you to decide what is best for your protection, recovery, or rehabilitation following an injury. The following are some general tips to help you use an elastic bandage.  Use the bandage as directed by the maker of the bandage you are using.  Do not wrap it too tight. This may cut off the circulation of the arm or leg below the bandage.  If part of your body beyond the bandage becomes blue, numb, or swollen, it is too tight. Loosen the bandage as needed to prevent these problems.  See your caregiver or trainer if the bandage seems to be making your problems worse rather than better. Bandages may be a reminder to you that you have an injury. However, they provide very little support. The few pounds of support they provide are minor considering the pressure it takes to injure a joint or tear ligaments. Therefore, the joint will not be able to handle all of the wear and tear it could before the injury. The routine care of many injuries includes Rest, Ice, Compression, and Elevation (RICE).  Rest is required to allow your body to heal. Generally, routine activities can be resumed when comfortable. Injured tendons and bones take about 6 weeks to heal.  Icing the injury helps keep the swelling down and reduces pain. Do not apply ice directly to the skin. Put ice in a plastic bag. Place a towel between the skin and the bag. This will prevent frostbite to the skin. Apply ice bags to the injured area for 15-20 minutes, every 2 hours while awake. Do this for the first 24 to 48 hours, then as directed by your caregiver.  Compression helps keep swelling down, gives support, and helps with discomfort. If an elastic bandage has been applied today, it should be removed and reapplied every 3 to 4 hours. It should not be applied tightly, but firmly enough to keep swelling down.  Watch fingers or toes for swelling, bluish discoloration, coldness, numbness, or increased pain. If any of these problems occur, remove the bandage and reapply it more loosely. If these problems persist, contact your caregiver.  Elevation helps reduce swelling and decreases pain. The injured area (arms, hands, legs, or feet) should be placed near to or above the heart (center of the chest) if able. Persistent pain and inability to use the injured area for more than 2 to 3 days are warning signs. You should see a caregiver for a follow-up visit as soon as possible. Initially, a minor broken bone (hairline fracture) may not be seen on X-rays. It may take 7 to 10 days to finally show up. Continued pain and swelling show that further evaluation and/or X-rays are needed. Make a follow-up visit with your caregiver. A specialist in reading X-rays (radiologist) will read your X-rays again. Finding out the results of your test Not all test results are available during your visit. If your test results are not back during the visit, make an appointment with your caregiver to find out the results. Do not assume everything is normal if you have not heard from your caregiver or the medical facility. It is important for you to follow up on all of your test results. Document Released: 12/15/2001 Document Revised: 09/17/2011 Document Reviewed: 10/27/2007 [facility] Patient Information 2015 [place], [place]. This information is not intended to replace advice given to you by your health care provider. Make sure  you discuss any questions you have with your health care provider.

## 2014-12-14 ENCOUNTER — Other Ambulatory Visit: Payer: Self-pay | Admitting: Obstetrics and Gynecology

## 2014-12-15 LAB — CYTOLOGY - PAP

## 2014-12-20 ENCOUNTER — Encounter: Payer: Self-pay | Admitting: Physician Assistant

## 2014-12-20 ENCOUNTER — Ambulatory Visit (INDEPENDENT_AMBULATORY_CARE_PROVIDER_SITE_OTHER): Payer: 59 | Admitting: Physician Assistant

## 2014-12-20 VITALS — BP 106/50 | HR 77 | Temp 98.4°F | Resp 16 | Ht 69.0 in | Wt 146.1 lb

## 2014-12-20 DIAGNOSIS — K2990 Gastroduodenitis, unspecified, without bleeding: Secondary | ICD-10-CM | POA: Diagnosis not present

## 2014-12-20 MED ORDER — RANITIDINE HCL 150 MG PO TABS: 150.0000 mg | ORAL_TABLET | Freq: Two times a day (BID) | ORAL | Status: DC

## 2014-12-20 NOTE — Assessment & Plan Note (Signed)
Rx Zantac 150 mg BID. Increase fluids.  Avoid trigger foods discussed. Will check CBC, CMP and lipase today. Follow-up PRN.

## 2014-12-20 NOTE — Progress Notes (Signed)
Patient presents to clinic today c/o epigastric discomfort starting last night after she lied down for bed. Was feeling fine earlier in the day. Denies heart burn or indigestion.  Denies alcohol consumption or NSAID use.  Denies nausea or vomiting.  Denies diarrhea or constipation.   Past Medical History  Diagnosis Date  . Chest pain   . Hip pain   . History of chicken pox     childhood  . Ovarian cyst 8/14    did rupture   . Allergy     seasonal    Current Outpatient Prescriptions on File Prior to Visit  Medication Sig Dispense Refill  . Calcium Carbonate-Vitamin D (CALCIUM + D PO) Take 1 tablet by mouth daily.    . cetirizine (ZYRTEC) 10 MG tablet Take 10 mg by mouth as needed.     . Coenzyme Q10 (COQ10 PO) Take 1 tablet by mouth daily.    . Nutritional Supplements (JUICE PLUS FIBRE PO) Take 4 tablets by mouth daily.     No current facility-administered medications on file prior to visit.    No Known Allergies  Family History  Problem Relation Age of Onset  . Heart attack Father   . Alcohol abuse Father     recovered  . Healthy Sister   . Cancer Maternal Grandmother     breast  . Prostate cancer Neg Hx   . Colon cancer Neg Hx   . Healthy Brother     1 of 1  . Supraventricular tachycardia Sister   . Stroke Mother     mini  . Tourette syndrome Son   . Alcohol abuse Paternal Grandmother   . Alcohol abuse Paternal Grandfather     History   Social History  . Marital Status: Married    Spouse Name: N/A  . Number of Children: N/A  . Years of Education: N/A   Social History Main Topics  . Smoking status: Never Smoker   . Smokeless tobacco: Never Used  . Alcohol Use: No  . Drug Use: No  . Sexual Activity:    Partners: Male    Birth Control/ Protection: IUD     Comment: lives with children and husband, no dietary restrictions   Other Topics Concern  . None   Social History Narrative   Review of Systems - See HPI.  All other ROS are negative.  BP  106/50 mmHg  Pulse 77  Temp(Src) 98.4 F (36.9 C) (Oral)  Resp 16  Ht 5' 9"  (1.753 m)  Wt 146 lb 2 oz (66.282 kg)  BMI 21.57 kg/m2  SpO2 98%  LMP 11/29/2014  Physical Exam  Constitutional: She is oriented to person, place, and time and well-developed, well-nourished, and in no distress.  HENT:  Head: Normocephalic and atraumatic.  Eyes: Conjunctivae are normal.  Cardiovascular: Normal rate, regular rhythm, normal heart sounds and intact distal pulses.   Pulmonary/Chest: Effort normal and breath sounds normal. No respiratory distress. She has no wheezes. She has no rales. She exhibits no tenderness.  Abdominal: Soft. Bowel sounds are normal. There is tenderness in the right upper quadrant, epigastric area and left upper quadrant.  Neurological: She is alert and oriented to person, place, and time.  Skin: Skin is warm and dry. No rash noted.  Psychiatric: Affect normal.  Vitals reviewed.   Recent Results (from the past 2160 hour(s))  Cytology - PAP     Status: None   Collection Time: 12/14/14 12:00 AM  Result Value Ref Range  CYTOLOGY - PAP PAP RESULT     Assessment/Plan: Gastritis and duodenitis Rx Zantac 150 mg BID. Increase fluids.  Avoid trigger foods discussed. Will check CBC, CMP and lipase today. Follow-up PRN.

## 2014-12-20 NOTE — Patient Instructions (Signed)
Please take the Zantac twice daily (A prescription has been sent in). Stay well hydrated. Avoid late-night eating. Avoid use of NSAIDs -- Ibuprofen, Aleve, Good Powders.  Don't forget to stop by the lab for blood work. I will call you with your results.

## 2014-12-21 LAB — CBC WITH DIFFERENTIAL/PLATELET
BASOS ABS: 0 10*3/uL (ref 0.0–0.1)
Basophils Relative: 0.2 % (ref 0.0–3.0)
Eosinophils Absolute: 0.2 10*3/uL (ref 0.0–0.7)
Eosinophils Relative: 2.7 % (ref 0.0–5.0)
HCT: 42.1 % (ref 36.0–46.0)
Hemoglobin: 14 g/dL (ref 12.0–15.0)
LYMPHS ABS: 2 10*3/uL (ref 0.7–4.0)
Lymphocytes Relative: 26.2 % (ref 12.0–46.0)
MCHC: 33.2 g/dL (ref 30.0–36.0)
MCV: 86.8 fl (ref 78.0–100.0)
MONOS PCT: 5.8 % (ref 3.0–12.0)
Monocytes Absolute: 0.5 10*3/uL (ref 0.1–1.0)
Neutro Abs: 5.1 10*3/uL (ref 1.4–7.7)
Neutrophils Relative %: 65.1 % (ref 43.0–77.0)
Platelets: 213 10*3/uL (ref 150.0–400.0)
RBC: 4.85 Mil/uL (ref 3.87–5.11)
RDW: 13.8 % (ref 11.5–15.5)
WBC: 7.8 10*3/uL (ref 4.0–10.5)

## 2014-12-21 LAB — COMPREHENSIVE METABOLIC PANEL
ALT: 16 U/L (ref 0–35)
AST: 17 U/L (ref 0–37)
Albumin: 4.3 g/dL (ref 3.5–5.2)
Alkaline Phosphatase: 56 U/L (ref 39–117)
BILIRUBIN TOTAL: 0.6 mg/dL (ref 0.2–1.2)
BUN: 10 mg/dL (ref 6–23)
CO2: 29 mEq/L (ref 19–32)
Calcium: 9.5 mg/dL (ref 8.4–10.5)
Chloride: 103 mEq/L (ref 96–112)
Creatinine, Ser: 0.73 mg/dL (ref 0.40–1.20)
GFR: 92.91 mL/min (ref >60.00)
GLUCOSE: 67 mg/dL — AB (ref 70–99)
Potassium: 3.8 mEq/L (ref 3.5–5.1)
Sodium: 138 mEq/L (ref 135–145)
Total Protein: 6.7 g/dL (ref 6.0–8.3)

## 2014-12-21 LAB — LIPASE: LIPASE: 21 U/L (ref 11.0–59.0)

## 2015-02-25 ENCOUNTER — Encounter: Payer: 59 | Admitting: Family Medicine

## 2015-03-03 ENCOUNTER — Emergency Department (HOSPITAL_BASED_OUTPATIENT_CLINIC_OR_DEPARTMENT_OTHER)
Admission: EM | Admit: 2015-03-03 | Discharge: 2015-03-03 | Disposition: A | Discharge status: Home / Self Care | Payer: 59 | Attending: Emergency Medicine | Admitting: Emergency Medicine

## 2015-03-03 ENCOUNTER — Encounter: Payer: Self-pay | Admitting: Medical

## 2015-03-03 ENCOUNTER — Ambulatory Visit (INDEPENDENT_AMBULATORY_CARE_PROVIDER_SITE_OTHER): Payer: 59 | Admitting: Medical

## 2015-03-03 ENCOUNTER — Encounter (HOSPITAL_BASED_OUTPATIENT_CLINIC_OR_DEPARTMENT_OTHER): Payer: Self-pay | Admitting: Emergency Medicine

## 2015-03-03 VITALS — BP 118/80 | HR 80 | Temp 98.3°F | Resp 16 | Ht 69.0 in | Wt 144.0 lb

## 2015-03-03 DIAGNOSIS — Z79899 Other long term (current) drug therapy: Secondary | ICD-10-CM | POA: Diagnosis not present

## 2015-03-03 DIAGNOSIS — Z8742 Personal history of other diseases of the female genital tract: Secondary | ICD-10-CM | POA: Insufficient documentation

## 2015-03-03 DIAGNOSIS — R51 Headache: Secondary | ICD-10-CM | POA: Diagnosis not present

## 2015-03-03 DIAGNOSIS — Z8619 Personal history of other infectious and parasitic diseases: Secondary | ICD-10-CM | POA: Diagnosis not present

## 2015-03-03 DIAGNOSIS — R519 Headache, unspecified: Secondary | ICD-10-CM

## 2015-03-03 MED ORDER — METOCLOPRAMIDE HCL 5 MG/ML IJ SOLN
10.0000 mg | Freq: Once | INTRAMUSCULAR | Status: AC
  Administered 2015-03-03: 10 mg via INTRAVENOUS
  Filled 2015-03-03: qty 2

## 2015-03-03 MED ORDER — DICLOFENAC SODIUM 75 MG PO TBEC: 75.0000 mg | DELAYED_RELEASE_TABLET | Freq: Two times a day (BID) | ORAL | Status: DC

## 2015-03-03 MED ORDER — CYCLOBENZAPRINE HCL 10 MG PO TABS: 10.0000 mg | ORAL_TABLET | Freq: Every day | ORAL | Status: DC

## 2015-03-03 MED ORDER — SODIUM CHLORIDE 0.9 % IV BOLUS (SEPSIS)
1000.0000 mL | Freq: Once | INTRAVENOUS | Status: AC
  Administered 2015-03-03: 1000 mL via INTRAVENOUS

## 2015-03-03 MED ORDER — KETOROLAC TROMETHAMINE 60 MG/2ML IM SOLN
60.0000 mg | Freq: Once | INTRAMUSCULAR | Status: AC
  Administered 2015-03-03: 60 mg via INTRAMUSCULAR

## 2015-03-03 MED ORDER — MAGNESIUM SULFATE 2 GM/50ML IV SOLN
2.0000 g | Freq: Once | INTRAVENOUS | Status: AC
  Administered 2015-03-03: 2 g via INTRAVENOUS
  Filled 2015-03-03: qty 50

## 2015-03-03 MED ORDER — DIPHENHYDRAMINE HCL 50 MG/ML IJ SOLN
25.0000 mg | Freq: Once | INTRAMUSCULAR | Status: AC
  Administered 2015-03-03: 25 mg via INTRAVENOUS
  Filled 2015-03-03: qty 1

## 2015-03-03 MED ORDER — PROCHLORPERAZINE MALEATE 10 MG PO TABS: 10.0000 mg | ORAL_TABLET | Freq: Three times a day (TID) | ORAL | Status: DC | PRN

## 2015-03-03 MED ORDER — METHYLPREDNISOLONE SODIUM SUCC 125 MG IJ SOLR
125.0000 mg | Freq: Once | INTRAMUSCULAR | Status: AC
  Administered 2015-03-03: 125 mg via INTRAVENOUS
  Filled 2015-03-03: qty 2

## 2015-03-03 NOTE — ED Provider Notes (Signed)
CSN: [id number]     Arrival date & time 03/03/15  1440 History   None    Chief Complaint  Patient presents with  . Migraine     (Consider location/radiation/quality/duration/timing/severity/associated sxs/prior Treatment) HPI  Blood pressure 98/58, pulse 80, temperature 98.9 F (37.2 C), temperature source Oral, resp. rate 12, height 5' 9"  (1.753 m), weight 137 lb (62.143 kg), SpO2 100 %.  [name] is a 44 y.o. female complaining of headache in the occipital region that radiates to the front of the head, states that it feels like somebody is pressing on her brain. She's tried Aleve and was given IM Toradol with little relief. States that she has chronic headaches but however this is different in that it is not alleviated with over-the-counter medications. She is photophobic but not phonophobic, she's had nausea with no vomiting. Pt denies fever, rash, confusion, cervicalgia, LOC/syncope, change in vision, numbness, weakness, dysarthria, ataxia, thunderclap onset, exacerbation with exertion or valsalva, exacerbation in morning, CP, SOB, abdominal pain.   Past Medical History  Diagnosis Date  . Chest pain   . Hip pain   . History of chicken pox     childhood  . Ovarian cyst 8/14    did rupture   . Allergy     seasonal   Past Surgical History  Procedure Laterality Date  . Knee surgery  44 yrs old    right  . Wisdom tooth extraction  44 yrs old   Family History  Problem Relation Age of Onset  . Heart attack Father   . Alcohol abuse Father     recovered  . Healthy Sister   . Cancer Maternal Grandmother     breast  . Prostate cancer Neg Hx   . Colon cancer Neg Hx   . Healthy Brother     1 of 1  . Supraventricular tachycardia Sister   . Stroke Mother     mini  . Tourette syndrome Son   . Alcohol abuse Paternal Grandmother   . Alcohol abuse Paternal Grandfather    Social History  Substance Use Topics  . Smoking status: Never Smoker   . Smokeless tobacco:  Never Used  . Alcohol Use: No   OB History    No data available     Review of Systems  10 systems reviewed and found to be negative, except as noted in the HPI.   Allergies  Review of patient's allergies indicates no known allergies.  Home Medications   Prior to Admission medications   Medication Sig Start Date End Date Taking? Authorizing Provider  Calcium Carbonate-Vitamin D (CALCIUM + D PO) Take 1 tablet by mouth daily.    Historical Provider, MD  cetirizine (ZYRTEC) 10 MG tablet Take 10 mg by mouth as needed.     Historical Provider, MD  Coenzyme Q10 (COQ10 PO) Take 1 tablet by mouth daily.    Historical Provider, MD  cyclobenzaprine (FLEXERIL) 10 MG tablet Take 1 tablet (10 mg total) by mouth at bedtime. 03/03/15   Mackie Pai, PA-C  diclofenac (VOLTAREN) 75 MG EC tablet Take 1 tablet (75 mg total) by mouth 2 (two) times daily. 03/03/15   Mackie Pai, PA-C  Nutritional Supplements (JUICE PLUS FIBRE PO) Take 4 tablets by mouth daily.    Historical Provider, MD  ranitidine (ZANTAC) 150 MG tablet Take 1 tablet (150 mg total) by mouth 2 (two) times daily. 12/20/14   Brunetta Jeans, PA-C   BP 98/58 mmHg  Pulse 80  Temp(Src) 98.9 F (37.2 C) (Oral)  Resp 12  Ht 5' 9"  (1.753 m)  Wt 137 lb (62.143 kg)  BMI 20.22 kg/m2  SpO2 100% Physical Exam  Constitutional: She is oriented to person, place, and time. She appears well-developed and well-nourished. No distress.  HENT:  Head: Normocephalic and atraumatic.  Mouth/Throat: Oropharynx is clear and moist.  Eyes: Conjunctivae and EOM are normal. Pupils are equal, round, and reactive to light.  No TTP of maxillary or frontal sinuses  No TTP or induration of temporal arteries bilaterally  Neck: Normal range of motion. Neck supple.  FROM to C-spine. Pt can touch chin to chest without discomfort. No TTP of midline cervical spine.   Cardiovascular: Normal rate, regular rhythm and intact distal pulses.   Pulmonary/Chest: Effort  normal and breath sounds normal. No stridor. No respiratory distress. She has no wheezes. She has no rales. She exhibits no tenderness.  Abdominal: Soft. Bowel sounds are normal. There is no tenderness.  Musculoskeletal: Normal range of motion. She exhibits no edema or tenderness.  Neurological: She is alert and oriented to person, place, and time. No cranial nerve deficit.  II-Visual fields grossly intact. III/IV/VI-Extraocular movements intact.  Pupils reactive bilaterally. V/VII-Smile symmetric, equal eyebrow raise,  facial sensation intact VIII- Hearing grossly intact IX/X-Normal gag XI-bilateral shoulder shrug XII-midline tongue extension Motor: 5/5 bilaterally with normal tone and bulk Cerebellar: Normal finger-to-nose  and normal heel-to-shin test.   Romberg negative Ambulates with a coordinated gait   Psychiatric: She has a normal mood and affect.  Nursing note and vitals reviewed.   ED Course  Procedures (including critical care time) Labs Review Labs Reviewed - No data to display  Imaging Review No results found. I have personally reviewed and evaluated these images and lab results as part of my medical decision-making.   EKG Interpretation None      MDM   Final diagnoses:  Nonintractable headache, unspecified chronicity pattern, unspecified headache type    Filed Vitals:   03/03/15 1453 03/03/15 1454 03/03/15 1455  BP: 97/26 88/53 98/58   Pulse: 80    Temp: 98.9 F (37.2 C)    TempSrc: Oral    Resp: 12    Height: 5' 9"  (1.753 m)    Weight: 137 lb (62.143 kg)    SpO2: 100%      Medications  sodium chloride 0.9 % bolus 1,000 mL (0 mLs Intravenous Stopped 03/03/15 1704)  metoCLOPramide (REGLAN) injection 10 mg (10 mg Intravenous Given 03/03/15 1522)  diphenhydrAMINE (BENADRYL) injection 25 mg (25 mg Intravenous Given 03/03/15 1515)  methylPREDNISolone sodium succinate (SOLU-MEDROL) 125 mg/2 mL injection 125 mg (125 mg Intravenous Given 03/03/15 1518)   magnesium sulfate IVPB 2 g 50 mL (0 g Intravenous Stopped 03/03/15 1704)    [name] is a pleasant 44 y.o. female presenting with HA. Presentation is like pts typical HA and non concerning for [facility], ICH, Meningitis, or temporal arteritis. Pt is afebrile with no focal neuro deficits, nuchal rigidity, or change in vision. Pt is to follow up with PCP to discuss prophylactic medication. Pt verbalizes understanding and is agreeable with plan to dc.  Evaluation does not show pathology that would require ongoing emergent intervention or inpatient treatment. Pt is hemodynamically stable and mentating appropriately. Discussed findings and plan with patient/guardian, who agrees with care plan. All questions answered. Return precautions discussed and outpatient follow up given.   New Prescriptions   PROCHLORPERAZINE (COMPAZINE) 10 MG TABLET    Take 1 tablet (10 mg  total) by mouth every 8 (eight) hours as needed for vomiting (Headache).         Monico Blitz, PA-C 03/03/15 [place], MD 03/03/15 [phone]

## 2015-03-03 NOTE — Patient Instructions (Addendum)
With the severity of ha 8-9/10 level with no improvement at all with toradol, this being one of worse ha you have experienced, and neck pain worsening with chin to chest movement, I do want you to be evaluated in the ED now. Contacted the charge nurse and explained presentation.  Note some features of ha may be tension vs migraine ha. But I think further work up needed.   Note the burning tongue sensation (present for week or more) may require work up such as tsh, cmp, iron, and vitamin b. I would defer this to your pcp.  Marland Kitchen

## 2015-03-03 NOTE — Discharge Instructions (Signed)
Do not drive, combine with alcohol or operate heavy machinery when you take Compazine as it will make you drowsy and slow your reactions time.  Please follow with your primary care doctor in the next 2 days for a check-up. They must obtain records for further management.   Do not hesitate to return to the Emergency Department for any new, worsening or concerning symptoms.   General Headache Without Cause A headache is pain or discomfort felt around the head or neck area. The specific cause of a headache may not be found. There are many causes and types of headaches. A few common ones are:  Tension headaches.  Migraine headaches.  Cluster headaches.  Chronic daily headaches. HOME CARE INSTRUCTIONS   Keep all follow-up appointments with your caregiver or any specialist referral.  Only take over-the-counter or prescription medicines for pain or discomfort as directed by your caregiver.  Lie down in a dark, quiet room when you have a headache.  Keep a headache journal to find out what may trigger your migraine headaches. For example, write down:  What you eat and drink.  How much sleep you get.  Any change to your diet or medicines.  Try massage or other relaxation techniques.  Put ice packs or heat on the head and neck. Use these 3 to 4 times per day for 15 to 20 minutes each time, or as needed.  Limit stress.  Sit up straight, and do not tense your muscles.  Quit smoking if you smoke.  Limit alcohol use.  Decrease the amount of caffeine you drink, or stop drinking caffeine.  Eat and sleep on a regular schedule.  Get 7 to 9 hours of sleep, or as recommended by your caregiver.  Keep lights dim if bright lights bother you and make your headaches worse. SEEK MEDICAL CARE IF:   You have problems with the medicines you were prescribed.  Your medicines are not working.  You have a change from the usual headache.  You have nausea or vomiting. SEEK IMMEDIATE MEDICAL  CARE IF:   Your headache becomes severe.  You have a fever.  You have a stiff neck.  You have loss of vision.  You have muscular weakness or loss of muscle control.  You start losing your balance or have trouble walking.  You feel faint or pass out.  You have severe symptoms that are different from your first symptoms. MAKE SURE YOU:   Understand these instructions.  Will watch your condition.  Will get help right away if you are not doing well or get worse. Document Released: 06/25/2005 Document Revised: 09/17/2011 Document Reviewed: 07/11/2011 [facility] Patient Information 2015 [place], [place]. This information is not intended to replace advice given to you by your health care provider. Make sure you discuss any questions you have with your health care provider.

## 2015-03-03 NOTE — Progress Notes (Signed)
Subjective:    Patient ID: [name], female    DOB: Sep 20, 1970, 44 y.o.   MRN: [id number]  HPI   Pt in with ha for last 3 days. Pt states she has history of occasional headaches in the past. Most related to allergies or sinus issues.   Pt states this headache came on at work. Felt dizzy, tingling and sweating and then severe headache. Pt took one alleve the first day. It did help. When ha 3 days ago came on was level 9 ha on Tuesday. Alleve brought ha to level 3. But never went away. All Wednesday low grade ha. On Tuesday with ha had light sensitivity. Light sensitive for  3 days. Some nausea.  Pt had some ha when young and she thinks she had migraines. Describes visual disturbance precedning ha.  LMP- 2 wks ago.  Pt describes a lot of stress at work. Pt Sales promotion account executive.  No slurred speech, no vision changes. No gross motor or sensory function deficits.  Slight  burning sensation to tongue today.     Review of Systems  Constitutional: Negative for fever, chills and fatigue.  Respiratory: Negative for cough, choking, chest tightness, shortness of breath and wheezing.   Gastrointestinal: Negative for abdominal pain.  Musculoskeletal: Negative for back pain.  Neurological: Positive for headaches. Negative for dizziness, syncope, facial asymmetry, speech difficulty, weakness and light-headedness.       Light sensitivity.  Hematological: Negative for adenopathy. Does not bruise/bleed easily.  Psychiatric/Behavioral: Negative for behavioral problems and confusion.    Past Medical History  Diagnosis Date  . Chest pain   . Hip pain   . History of chicken pox     childhood  . Ovarian cyst 8/14    did rupture   . Allergy     seasonal    Social History   Social History  . Marital Status: Married    Spouse Name: N/A  . Number of Children: N/A  . Years of Education: N/A   Occupational History  . Not on file.   Social History Main Topics  . Smoking status:  Never Smoker   . Smokeless tobacco: Never Used  . Alcohol Use: No  . Drug Use: No  . Sexual Activity:    Partners: Male    Birth Control/ Protection: IUD     Comment: lives with children and husband, no dietary restrictions   Other Topics Concern  . Not on file   Social History Narrative    Past Surgical History  Procedure Laterality Date  . Knee surgery  44 yrs old    right  . Wisdom tooth extraction  44 yrs old    Family History  Problem Relation Age of Onset  . Heart attack Father   . Alcohol abuse Father     recovered  . Healthy Sister   . Cancer Maternal Grandmother     breast  . Prostate cancer Neg Hx   . Colon cancer Neg Hx   . Healthy Brother     1 of 1  . Supraventricular tachycardia Sister   . Stroke Mother     mini  . Tourette syndrome Son   . Alcohol abuse Paternal Grandmother   . Alcohol abuse Paternal Grandfather     No Known Allergies  Current Outpatient Prescriptions on File Prior to Visit  Medication Sig Dispense Refill  . Calcium Carbonate-Vitamin D (CALCIUM + D PO) Take 1 tablet by mouth daily.    . cetirizine (ZYRTEC)  10 MG tablet Take 10 mg by mouth as needed.     . Coenzyme Q10 (COQ10 PO) Take 1 tablet by mouth daily.    . Nutritional Supplements (JUICE PLUS FIBRE PO) Take 4 tablets by mouth daily.    . ranitidine (ZANTAC) 150 MG tablet Take 1 tablet (150 mg total) by mouth 2 (two) times daily. 30 tablet 0   No current facility-administered medications on file prior to visit.    BP 118/80 mmHg  Pulse 80  Temp(Src) 98.3 F (36.8 C) (Oral)  Resp 16  Ht 5' 9"  (1.753 m)  Wt 144 lb (65.318 kg)  BMI 21.26 kg/m2  SpO2 98%       Objective:   Physical Exam  General Mental Status- Alert. General Appearance- Not in acute distress.   Skin General: Color- Normal Color. Moisture- Normal Moisture.  Neck Carotid Arteries- Normal color. Moisture- Normal Moisture. No carotid bruits. No JVD.  Chest and Lung Exam Auscultation: Breath  Sounds:-Normal. CTA.  Cardiovascular Auscultation:Rythm- Regular,Rate and Rhythm. Murmurs & Other Heart Sounds:Auscultation of the heart reveals- No Murmurs.  Abdomen Inspection:-Inspeection Normal. Palpation/Percussion:Note:No mass. Palpation and Percussion of the abdomen reveal- Non Tender, Non Distended + BS, no rebound or guarding.    Neurologic Cranial Nerve exam:- CN III-XII intact(No nystagmus), symmetric smile. Drift Test:- No drift. Romberg Exam:- Negative.  Heal to Toe Gait exam:-Normal. Finger to Nose:- Normal/Intact Strength:- 5/5 equal and symmetric strength both upper and lower extremities.      Assessment & Plan:  With the severity of ha 8-9/10 level with no improvement at all with toradol, this being one of worse ha you have experienced, and neck pain worsening with chin to chest movement, I do want you to be evaluated in the ED now. Contacted the charge nurse and explained presentation.  Note some features of ha may be tension vs migraine ha. But I think further work up needed.   Note the burning tongue sensation (present for week or more) may require work up such as tsh, cmp, iron, and vitamin b. I would defer this to your pcp.

## 2015-03-03 NOTE — ED Notes (Signed)
Pt in from office upstairs c/o headache onset x 2 days ago, has decreased with OTC meds but not gone away. Pt was given IM Toradol upstairs with no relief so provider sent her down for evaluation. Pt is neurologically intact per family at bedside with no noted facial droop, unilateral weakness, aphasia or dysarthria.

## 2015-03-03 NOTE — Progress Notes (Signed)
Pre visit review using our clinic review tool, if applicable. No additional management support is needed unless otherwise documented below in the visit note. 

## 2015-03-04 ENCOUNTER — Ambulatory Visit: Payer: 59 | Admitting: Physician Assistant

## 2015-03-07 ENCOUNTER — Ambulatory Visit (INDEPENDENT_AMBULATORY_CARE_PROVIDER_SITE_OTHER): Payer: 59 | Admitting: Neurology

## 2015-03-07 ENCOUNTER — Encounter: Payer: Self-pay | Admitting: Neurology

## 2015-03-07 VITALS — BP 97/68 | HR 100 | Ht 69.0 in | Wt 146.6 lb

## 2015-03-07 DIAGNOSIS — G441 Vascular headache, not elsewhere classified: Secondary | ICD-10-CM | POA: Diagnosis not present

## 2015-03-07 DIAGNOSIS — G44209 Tension-type headache, unspecified, not intractable: Secondary | ICD-10-CM

## 2015-03-07 DIAGNOSIS — G971 Other reaction to spinal and lumbar puncture: Secondary | ICD-10-CM

## 2015-03-07 DIAGNOSIS — R51 Headache: Secondary | ICD-10-CM

## 2015-03-07 DIAGNOSIS — R519 Headache, unspecified: Secondary | ICD-10-CM | POA: Insufficient documentation

## 2015-03-07 NOTE — Progress Notes (Signed)
[facility] [address] [place]. [place] [address] [phone]       OFFICE CONSULT NOTE  Maria. [name] Date of Birth:  1971/07/09 Medical Record Number:  [id number]   Referring MD: Quintella Reichert  Reason for Referral:  Headache  HPI: [name] is a 44 year Caucasian lady who has been complaining of new onset headaches for the last 6 days. She states she underwent a dry needling procedure by physical therapist on 02/22/15 and during the procedure noticed sharp shooting pain in her back and leg which subsided after procedure was stopped. Several days later she started noticing headache which he describes as being positional mainly in sitting position she describes her severe pressure sensation in the back of her head moving to the vertex and to the front. It is accompanied by nausea but no vomiting. Light and sound bother her and then headache increases with physical activities like sitting but interestingly she does not get a headache when she is standing. Headache image it resolves when she lies down. She has tried taking Excedrin Migraine and Sudafed which haven't worked. She was seen in the emergency room a few days ago and was given the typical migraine cocktail injection which did not work. She has no prior history of migraine headaches but has had occasional sinus headaches off and on which are quite different in character than the present headaches. She has history of fall at age 38 as well as more recent fall in December following which she developed low back pain near her tailbone for which she was undergoing physical therapy and dry needling at [facility] therapy in [place]. She denies any loss of vision, double vision, blurred vision, gait or balance problems. She has not had any brain imaging study done. She complains of being under increased stress at work. She has also noticed for the last 3 weeks as sensation of burning on her tongue which is constant as  if her tongue got component with something hot. She has to drink fluids constantly 2 E. the sensation.  ROS:   14 system review of systems is positive for  Headache, increased thirst, not enough sleep, skin moles and all other systems negative  PMH:  Past Medical History  Diagnosis Date  . Chest pain   . Hip pain   . History of chicken pox     childhood  . Ovarian cyst 8/14    did rupture   . Allergy     seasonal  . Headache     Social History:  Social History   Social History  . Marital Status: Married    Spouse Name: N/A  . Number of Children: N/A  . Years of Education: N/A   Occupational History  . Not on file.   Social History Main Topics  . Smoking status: Never Smoker   . Smokeless tobacco: Never Used  . Alcohol Use: No  . Drug Use: No  . Sexual Activity:    Partners: Male    Birth Control/ Protection: IUD     Comment: lives with children and husband, no dietary restrictions   Other Topics Concern  . Not on file   Social History Narrative    Medications:   Current Outpatient Prescriptions on File Prior to Visit  Medication Sig Dispense Refill  . Calcium Carbonate-Vitamin D (CALCIUM + D PO) Take 1 tablet by mouth daily.    . cetirizine (ZYRTEC) 10 MG tablet Take 10 mg by mouth as needed.     Marland Kitchen  cyclobenzaprine (FLEXERIL) 10 MG tablet Take 1 tablet (10 mg total) by mouth at bedtime. 3 tablet 0  . diclofenac (VOLTAREN) 75 MG EC tablet Take 1 tablet (75 mg total) by mouth 2 (two) times daily. 20 tablet 0  . Nutritional Supplements (JUICE PLUS FIBRE PO) Take 4 tablets by mouth daily.     No current facility-administered medications on file prior to visit.    Allergies:  No Known Allergies  Physical Exam General: well developed, well nourished, seated, in no evident distress Head: head normocephalic and atraumatic.   Neck: supple with no carotid or supraclavicular bruits Cardiovascular: regular rate and rhythm, no murmurs Musculoskeletal: no  deformity. Mild spasm of posterior neck muscles but no restriction of movements. Skin:  no rash/petichiae Vascular:  Normal pulses all extremities  Neurologic Exam Mental Status: Awake and fully alert. Oriented to place and time. Recent and remote memory intact. Attention span, concentration and fund of knowledge appropriate. Mood and affect appropriate.  Cranial Nerves: Fundoscopic exam reveals sharp disc margins. Pupils equal, briskly reactive to light. Extraocular movements full without nystagmus. Visual fields full to confrontation. Hearing intact. Facial sensation intact. Face, tongue, palate moves normally and symmetrically.  Motor: Normal bulk and tone. Normal strength in all tested extremity muscles. Sensory.: intact to touch , pinprick , position and vibratory sensation.  Coordination: Rapid alternating movements normal in all extremities. Finger-to-nose and heel-to-shin performed accurately bilaterally. Gait and Station: Arises from chair without difficulty. Stance is normal. Gait demonstrates normal stride length and balance . Able to heel, toe and tandem walk without difficulty.  Reflexes: 1+ and symmetric. Toes downgoing.       ASSESSMENT: 64 year Caucasian lady with new onset holocephalic headache which is positional and likely represents spinal headache with component of muscle tension headache. This incidentally started after drying needling procedure on her low back.    PLAN: I had a long discussion with the patient and her husband regarding her headache and discuss spinal headache as well as muscle tension headaches and answered questions. My review of literature for dry needling procedure does not mention spinal headaches as a possible complication and it would be difficult to explain this from the procedure however the time relationship cannot be cannot be ignored. I recommend she drink plenty of fluids as well as caffeine and do regular neck stretching exercises as well as  participate in activities for stress relaxation like meditation and yoga. I do not believe any specific medications are indicated at the present time since her headaches are positional and transient or diagnostic testing is needed. If her headaches improve the next couple of weeks no further follow-up is necessary with me. She may call and make follow-up appointment in case the headaches persist or she has new symptoms. Antony Contras, MD Note: This document was prepared with digital dictation and possible smart phrase technology. Any transcriptional errors that result from this process are unintentional.

## 2015-03-07 NOTE — Patient Instructions (Signed)
I had a long discussion with the patient and her husband regarding her headache and discuss spinal headache as well as muscle tension headaches and answered questions. I recommend she drink plenty of fluids as well as caffeine and do regular neck stretching exercises as well as participate in activities for stress relaxation like meditation and yoga. I do not believe any specific medications are indicated at the present time since her headaches are positional and transient or diagnostic testing is needed. If her headaches improve the next couple of weeks no further follow-up is necessary with me. She may call and make follow-up appointment in case the headaches persist or she has new symptoms. Spinal Headache A spinal headache is a severe headache that can happen after getting a spinal tap, also called lumbar puncture, or an epidural anesthetic. Both of these procedures involve passing a needle through ligaments that run along the back side of your spinal column and into one of the spaces just above your spinal cord. Sometimes spinal fluid leaks through the temporary hole left by the needle. This leak causes a decrease in spinal fluid pressure, which leads to a spinal headache. The headache usually begins within hours or 1-2 days after the procedure, and it lasts until adequate pressure returns as your body creates more spinal fluid. The headache can last a few days and rarely lasts for more than 1 week. SIGNS AND SYMPTOMS   Severe headache pain when sitting or standing.  Decreased headache pain when lying down.  Neck pain, especially when flexing the neck in a chin-to-chest position.  Vomiting. DIAGNOSIS  Diagnosis of spinal headache is usually made based on your recent medical history. Your health care provider will consider the timing of a recent spinal tap or epidural anesthetic, along with how soon your headache occurred afterward. On rare occasions, tests may be done to confirm the diagnosis, such  as an MRI. TREATMENT  Treatment may include:  Drinking extra fluids to improve your level of hydration. This will help your body replace the spinal fluid that has leaked out through the needle hole. Receiving IV fluids may be necessary.  Taking pain medicine as prescribed by your health care provider.  Drinking caffeinated beverages such as soda, coffee, or tea. Caffeine may help to shrink the blood vessels in your brain, which may reduce your headache pain.  Lying flat for a few days.  Having a blood patch procedure, which involves injecting a small amount of your blood at the puncture site to seal the leak. HOME CARE INSTRUCTIONS  Lie down to relieve pain if your pain gets worse when you sit or stand.  Drink enough fluids to keep your urine clear or pale yellow.  Take pain medicine as directed by your health care provider. SEEK IMMEDIATE MEDICAL CARE IF:   Your pain becomes very severe or cannot be controlled.  You develop a fever.  You have a stiff neck.  You lose bowel or bladder control.  You have trouble walking. MAKE SURE YOU:  Understand these instructions.  Will watch your condition.   Will get help right away if you are not doing well or get worse. Document Released: 12/15/2001 Document Revised: 06/30/2013 Document Reviewed: 01/15/2013 [facility] Patient Information 2015 [place], [place]. This information is not intended to replace advice given to you by your health care provider. Make sure you discuss any questions you have with your health care provider. Tension Headache A tension headache is a feeling of pain, pressure, or aching often  felt over the front and sides of the head. The pain can be dull or can feel tight (constricting). It is the most common type of headache. Tension headaches are not normally associated with nausea or vomiting and do not get worse with physical activity. Tension headaches can last 30 minutes to several days.  CAUSES  The exact cause  is not known, but it may be caused by chemicals and hormones in the brain that lead to pain. Tension headaches often begin after stress, anxiety, or depression. Other triggers may include:  Alcohol.  Caffeine (too much or withdrawal).  Respiratory infections (colds, flu, sinus infections).  Dental problems or teeth clenching.  Fatigue.  Holding your head and neck in one position too long while using a computer. SYMPTOMS   Pressure around the head.   Dull, aching head pain.   Pain felt over the front and sides of the head.   Tenderness in the muscles of the head, neck, and shoulders. DIAGNOSIS  A tension headache is often diagnosed based on:   Symptoms.   Physical examination.   A CT scan or MRI of your head. These tests may be ordered if symptoms are severe or unusual. TREATMENT  Medicines may be given to help relieve symptoms.  HOME CARE INSTRUCTIONS   Only take over-the-counter or prescription medicines for pain or discomfort as directed by your caregiver.   Lie down in a dark, quiet room when you have a headache.   Keep a journal to find out what may be triggering your headaches. For example, write down:  What you eat and drink.  How much sleep you get.  Any change to your diet or medicines.  Try massage or other relaxation techniques.   Ice packs or heat applied to the head and neck can be used. Use these 3 to 4 times per day for 15 to 20 minutes each time, or as needed.   Limit stress.   Sit up straight, and do not tense your muscles.   Quit smoking if you smoke.  Limit alcohol use.  Decrease the amount of caffeine you drink, or stop drinking caffeine.  Eat and exercise regularly.  Get 7 to 9 hours of sleep, or as recommended by your caregiver.  Avoid excessive use of pain medicine as recurrent headaches can occur.  SEEK MEDICAL CARE IF:   You have problems with the medicines you were prescribed.  Your medicines do not  work.  You have a change from the usual headache.  You have nausea or vomiting. SEEK IMMEDIATE MEDICAL CARE IF:   Your headache becomes severe.  You have a fever.  You have a stiff neck.  You have loss of vision.  You have muscular weakness or loss of muscle control.  You lose your balance or have trouble walking.  You feel faint or pass out.  You have severe symptoms that are different from your first symptoms. MAKE SURE YOU:   Understand these instructions.  Will watch your condition.  Will get help right away if you are not doing well or get worse. Document Released: 06/25/2005 Document Revised: 09/17/2011 Document Reviewed: 06/15/2011 [facility] Patient Information 2015 [place], [place]. This information is not intended to replace advice given to you by your health care provider. Make sure you discuss any questions you have with your health care provider.

## 2015-03-16 ENCOUNTER — Telehealth: Payer: Self-pay | Admitting: Neurology

## 2015-03-16 NOTE — Telephone Encounter (Signed)
[facility] called and states the pt called about additional symptoms the pt has been having. The therapist would like to coordinate with the Dr.  [phone], Myrla Halsted.

## 2015-03-16 NOTE — Telephone Encounter (Signed)
Rn call [name] back at [facility] at [phone]. Rn left message that a call back will be done once she is done working with her pts in therapy.

## 2015-03-16 NOTE — Telephone Encounter (Signed)
Patient called stating she is to have rolfing today at 4pm. [facility] is questioning whether pt should have therapy today. Please call and advise. Patient can be reached at [phone].

## 2015-03-16 NOTE — Telephone Encounter (Signed)
Rn talk to Dr. Leta Baptist look over patients chart concerning her therapy. Pt was inquiring whether or not she should have her [facility] today at 0400pm. Rn verified with patient that Dr.Serthi did not order the therapy, pt stated it was self referral. Dr. Leta Baptist stated we are only treating he for headaches according to the notes by Dr Leonie Man. Rn explain that its her decision to attend therapy or not. Dr. Leonie Man did not order her integrative therapy, pt was doing it prior to seeing Dr.Sethi. Rn advise to consult with her PCP about this issue.

## 2015-03-23 ENCOUNTER — Telehealth: Payer: Self-pay | Admitting: Neurology

## 2015-03-23 NOTE — Telephone Encounter (Signed)
Patients husband called stating her symptoms had seemed to be better but today she has a squeezing or pulling on the back of head, base of skull. She has taken OTC meds which have not helped (he doesn't know the names). She said it is not the awful pain as before but more of the squeezing pain. He said Dr Leonie Man advised her to call if she continued to have problems (she has been in meetings all day). Please call and advise. She can be reached at [phone] and if can't reach her call husband at [phone]

## 2015-03-24 ENCOUNTER — Telehealth: Payer: Self-pay

## 2015-03-24 NOTE — Telephone Encounter (Signed)
I spoke to the husband stated that the patient's sensation of blurred vision and dizziness seemed to settle down but she had a couple of visit is at work and stress and now is having muscle pulling tightness sensation in the back of her head which sound like tension headaches. I advised the patient to do neck stretching exercises and local light massage. If this did not improve over the next few days he was advised to call me back to try some muscle relaxants.

## 2015-03-24 NOTE — Telephone Encounter (Signed)
Rn call husband Shanon Brow about his wife's symptoms. He states she is not having any blurred vision, or dizzy spells. Patient is having the squeezing or pulling on the back of the head base of skull. He stated it happens when she is sitting per his wife. Rn explain Dr.Sethi is seeing patients today and will advise the patient.

## 2015-03-24 NOTE — Telephone Encounter (Signed)
LFt voice message concerning new symptoms of squeezing pain in back of her head. Nurse left message for patient to call back.

## 2015-03-28 NOTE — Telephone Encounter (Signed)
See Dr.Sethi note thanks he talk to patient/husband.

## 2015-06-22 ENCOUNTER — Telehealth: Payer: Self-pay | Admitting: Physician Assistant

## 2015-06-22 NOTE — Telephone Encounter (Signed)
Patient would like to transfer from Dr. Charlett Blake to Leeanne Rio, please advise

## 2015-06-22 NOTE — Telephone Encounter (Signed)
Fine with me if ok with Dr. Jacinto Reap.

## 2015-06-23 ENCOUNTER — Encounter: Payer: 59 | Admitting: Family Medicine

## 2015-06-23 NOTE — Telephone Encounter (Signed)
Fine with me

## 2015-06-23 NOTE — Telephone Encounter (Signed)
lvm advising patient

## 2015-06-27 ENCOUNTER — Telehealth: Payer: Self-pay | Admitting: Physician Assistant

## 2015-06-27 NOTE — Telephone Encounter (Signed)
Patient declined Flu Shot

## 2015-06-27 NOTE — Telephone Encounter (Signed)
Noted in HM.

## 2015-07-19 ENCOUNTER — Telehealth: Payer: Self-pay | Admitting: Behavioral Health

## 2015-07-19 ENCOUNTER — Encounter: Payer: Self-pay | Admitting: Behavioral Health

## 2015-07-19 NOTE — Telephone Encounter (Signed)
Pre-Visit Call completed with patient and chart updated.   Pre-Visit Info documented in Specialty Comments under [employer].    

## 2015-07-20 ENCOUNTER — Encounter: Payer: Self-pay | Admitting: Physician Assistant

## 2015-07-20 ENCOUNTER — Ambulatory Visit (INDEPENDENT_AMBULATORY_CARE_PROVIDER_SITE_OTHER): Payer: 59 | Admitting: Physician Assistant

## 2015-07-20 VITALS — BP 90/50 | HR 104 | Temp 97.8°F | Ht 69.0 in | Wt 144.8 lb

## 2015-07-20 DIAGNOSIS — Z0000 Encounter for general adult medical examination without abnormal findings: Secondary | ICD-10-CM | POA: Diagnosis not present

## 2015-07-20 LAB — COMPREHENSIVE METABOLIC PANEL
ALBUMIN: 4.5 g/dL (ref 3.5–5.2)
ALT: 15 U/L (ref 0–35)
AST: 15 U/L (ref 0–37)
Alkaline Phosphatase: 60 U/L (ref 39–117)
BILIRUBIN TOTAL: 0.6 mg/dL (ref 0.2–1.2)
BUN: 9 mg/dL (ref 6–23)
CALCIUM: 9.6 mg/dL (ref 8.4–10.5)
CO2: 33 meq/L — AB (ref 19–32)
Chloride: 102 mEq/L (ref 96–112)
Creatinine, Ser: 0.68 mg/dL (ref 0.40–1.20)
GFR: 100.55 mL/min (ref >60.00)
Glucose, Bld: 85 mg/dL (ref 70–99)
Potassium: 3.8 mEq/L (ref 3.5–5.1)
Sodium: 140 mEq/L (ref 135–145)
Total Protein: 6.8 g/dL (ref 6.0–8.3)

## 2015-07-20 LAB — URINALYSIS, ROUTINE W REFLEX MICROSCOPIC
BILIRUBIN URINE: NEGATIVE
Hgb urine dipstick: NEGATIVE
KETONES UR: NEGATIVE
Leukocytes, UA: NEGATIVE
NITRITE: NEGATIVE
RBC / HPF: NONE SEEN (ref 0–?)
Specific Gravity, Urine: 1.015 (ref 1.000–1.030)
Total Protein, Urine: NEGATIVE
URINE GLUCOSE: NEGATIVE
Urobilinogen, UA: 0.2 (ref 0.0–1.0)
pH: 7 (ref 5.0–8.0)

## 2015-07-20 LAB — LIPID PANEL
CHOL/HDL RATIO: 3
Cholesterol: 160 mg/dL (ref 0–200)
HDL: 46.9 mg/dL (ref >39.00)
LDL CALC: 95 mg/dL (ref 0–99)
NONHDL: 113.3
TRIGLYCERIDES: 94 mg/dL (ref 0.0–149.0)
VLDL: 18.8 mg/dL (ref 0.0–40.0)

## 2015-07-20 LAB — CBC
HCT: 44.4 % (ref 36.0–46.0)
Hemoglobin: 14.7 g/dL (ref 12.0–15.0)
MCHC: 33.1 g/dL (ref 30.0–36.0)
MCV: 85.7 fl (ref 78.0–100.0)
PLATELETS: 239 10*3/uL (ref 150.0–400.0)
RBC: 5.18 Mil/uL — AB (ref 3.87–5.11)
RDW: 12.9 % (ref 11.5–15.5)
WBC: 6.9 10*3/uL (ref 4.0–10.5)

## 2015-07-20 LAB — TSH: TSH: 1.74 u[IU]/mL (ref 0.35–4.50)

## 2015-07-20 LAB — HEMOGLOBIN A1C: HEMOGLOBIN A1C: 5.5 % (ref 4.6–6.5)

## 2015-07-20 NOTE — Assessment & Plan Note (Signed)
Depression screen negative. Health Maintenance reviewed -- Pt Defers mammogram to GYN. PAP up-to-date. Declines flu shot. Preventive schedule discussed and handout given in AVS. Will obtain fasting labs today.

## 2015-07-20 NOTE — Progress Notes (Signed)
Pre visit review using our clinic review tool, if applicable. No additional management support is needed unless otherwise documented below in the visit note. 

## 2015-07-20 NOTE — Progress Notes (Signed)
Patient presents to clinic today for annual exam.  Patient is fasting for labs. Denies acute concerns today. Is a picky eater but is working on Administrator, sports. Has a hard time with vegetables as she does not like them. Is watching portion sizes. Body mass index is 21.37 kg/(m^2).  Health Maintenance: Dental -- up-to-date Immunizations -- Declines flu shot. Is unsure of Tetanus but feels has been 5-6 years. Mammogram -- Followed by GYN ([facility] for Women). Wants to defer to them. PAP -- up-to-date. Normal per patient.  Past Medical History  Diagnosis Date  . Chest pain   . Hip pain   . History of chicken pox     childhood  . Ovarian cyst 8/14    did rupture   . Allergy     seasonal  . Headache     Past Surgical History  Procedure Laterality Date  . Knee surgery  45 yrs old    right  . Wisdom tooth extraction  45 yrs old    Current Outpatient Prescriptions on File Prior to Visit  Medication Sig Dispense Refill  . Calcium Carbonate-Vitamin D (CALCIUM + D PO) Take 1 tablet by mouth daily.    . Nutritional Supplements (JUICE PLUS FIBRE PO) Take 4 tablets by mouth daily.     No current facility-administered medications on file prior to visit.    No Known Allergies  Family History  Problem Relation Age of Onset  . Heart attack Father   . Alcohol abuse Father     recovered  . Healthy Sister   . Cancer Maternal Grandmother     breast  . Prostate cancer Neg Hx   . Colon cancer Neg Hx   . Healthy Brother     1 of 1  . Supraventricular tachycardia Sister   . Stroke Mother     mini  . Tourette syndrome Son   . Alcohol abuse Paternal Grandmother   . Alcohol abuse Paternal Grandfather     Social History   Social History  . Marital Status: Married    Spouse Name: N/A  . Number of Children: N/A  . Years of Education: N/A   Occupational History  . Not on file.   Social History Main Topics  . Smoking status: Never Smoker   . Smokeless tobacco: Never Used  .  Alcohol Use: No  . Drug Use: No  . Sexual Activity:    Partners: Male    Birth Control/ Protection: IUD     Comment: lives with children and husband, no dietary restrictions   Other Topics Concern  . Not on file   Social History Narrative   Review of Systems  Constitutional: Negative for fever and weight loss.  HENT: Negative for ear discharge, ear pain, hearing loss and tinnitus.   Eyes: Negative for blurred vision, double vision, photophobia and pain.  Respiratory: Negative for cough and shortness of breath.   Cardiovascular: Negative for chest pain and palpitations.  Gastrointestinal: Negative for heartburn, nausea, vomiting, abdominal pain, diarrhea, constipation, blood in stool and melena.  Genitourinary: Negative for dysuria, urgency, frequency, hematuria and flank pain.  Musculoskeletal: Negative for falls.  Neurological: Negative for dizziness, loss of consciousness and headaches.  Endo/Heme/Allergies: Negative for environmental allergies.  Psychiatric/Behavioral: Negative for depression, suicidal ideas, hallucinations and substance abuse. The patient is not nervous/anxious and does not have insomnia.    BP 90/50 mmHg  Pulse 104  Temp(Src) 97.8 F (36.6 C) (Oral)  Ht 5' 9"  (1.753  m)  Wt 144 lb 12.8 oz (65.681 kg)  BMI 21.37 kg/m2  SpO2 98%  LMP 07/06/2015  Physical Exam  Constitutional: She is oriented to person, place, and time and well-developed, well-nourished, and in no distress.  HENT:  Head: Normocephalic and atraumatic.  Right Ear: Tympanic membrane, external ear and ear canal normal.  Left Ear: Tympanic membrane, external ear and ear canal normal.  Nose: Nose normal. No mucosal edema.  Mouth/Throat: Uvula is midline, oropharynx is clear and moist and mucous membranes are normal. No oropharyngeal exudate or posterior oropharyngeal erythema.  Eyes: Conjunctivae are normal. Pupils are equal, round, and reactive to light.  Neck: Neck supple. No thyromegaly  present.  Cardiovascular: Normal rate, regular rhythm, normal heart sounds and intact distal pulses.   Pulmonary/Chest: Effort normal and breath sounds normal. No respiratory distress. She has no wheezes. She has no rales.  Abdominal: Soft. Bowel sounds are normal. She exhibits no distension and no mass. There is no tenderness. There is no rebound and no guarding.  Lymphadenopathy:    She has no cervical adenopathy.  Neurological: She is alert and oriented to person, place, and time. No cranial nerve deficit.  Skin: Skin is warm and dry. No rash noted.  Psychiatric: Affect normal.  Vitals reviewed.  No results found for this or any previous visit (from the past 2160 hour(s)).  Assessment/Plan: Visit for preventive health examination Depression screen negative. Health Maintenance reviewed -- Pt Defers mammogram to GYN. PAP up-to-date. Declines flu shot. Preventive schedule discussed and handout given in AVS. Will obtain fasting labs today.

## 2015-07-20 NOTE — Patient Instructions (Signed)
Please go to the lab for blood work.  I will call you with your results. If your blood work is normal we will follow-up yearly for physicals.  If anything is abnormal we will treat you and get you in for a follow-up.Marland Kitchen  Please follow-up with your GYN as directed. Make sure to have them update your mammogram this year or call me if you decide you want me to take care of it.  Preventive Care for Adults, Female A healthy lifestyle and preventive care can promote health and wellness. Preventive health guidelines for women include the following key practices.  A routine yearly physical is a good way to check with your health care provider about your health and preventive screening. It is a chance to share any concerns and updates on your health and to receive a thorough exam.  Visit your dentist for a routine exam and preventive care every 6 months. Brush your teeth twice a day and floss once a day. Good oral hygiene prevents tooth decay and gum disease.  The frequency of eye exams is based on your age, health, family medical history, use of contact lenses, and other factors. Follow your health care provider's recommendations for frequency of eye exams.  Eat a healthy diet. Foods like vegetables, fruits, whole grains, low-fat dairy products, and lean protein foods contain the nutrients you need without too many calories. Decrease your intake of foods high in solid fats, added sugars, and salt. Eat the right amount of calories for you.Get information about a proper diet from your health care provider, if necessary.  Regular physical exercise is one of the most important things you can do for your health. Most adults should get at least 150 minutes of moderate-intensity exercise (any activity that increases your heart rate and causes you to sweat) each week. In addition, most adults need muscle-strengthening exercises on 2 or more days a week.  Maintain a healthy weight. The body mass index (BMI) is a  screening tool to identify possible weight problems. It provides an estimate of body fat based on height and weight. Your health care provider can find your BMI and can help you achieve or maintain a healthy weight.For adults 20 years and older:  A BMI below 18.5 is considered underweight.  A BMI of 18.5 to 24.9 is normal.  A BMI of 25 to 29.9 is considered overweight.  A BMI of 30 and above is considered obese.  Maintain normal blood lipids and cholesterol levels by exercising and minimizing your intake of saturated fat. Eat a balanced diet with plenty of fruit and vegetables. Blood tests for lipids and cholesterol should begin at age 68 and be repeated every 5 years. If your lipid or cholesterol levels are high, you are over 50, or you are at high risk for heart disease, you may need your cholesterol levels checked more frequently.Ongoing high lipid and cholesterol levels should be treated with medicines if diet and exercise are not working.  If you smoke, find out from your health care provider how to quit. If you do not use tobacco, do not start.  Lung cancer screening is recommended for adults aged 32-80 years who are at high risk for developing lung cancer because of a history of smoking. A yearly low-dose CT scan of the lungs is recommended for people who have at least a 30-pack-year history of smoking and are a current smoker or have quit within the past 15 years. A pack year of smoking is smoking  an average of 1 pack of cigarettes a day for 1 year (for example: 1 pack a day for 30 years or 2 packs a day for 15 years). Yearly screening should continue until the smoker has stopped smoking for at least 15 years. Yearly screening should be stopped for people who develop a health problem that would prevent them from having lung cancer treatment.  If you are pregnant, do not drink alcohol. If you are breastfeeding, be very cautious about drinking alcohol. If you are not pregnant and choose to  drink alcohol, do not have more than 1 drink per day. One drink is considered to be 12 ounces (355 mL) of beer, 5 ounces (148 mL) of wine, or 1.5 ounces (44 mL) of liquor.  Avoid use of street drugs. Do not share needles with anyone. Ask for help if you need support or instructions about stopping the use of drugs.  High blood pressure causes heart disease and increases the risk of stroke. Your blood pressure should be checked at least every 1 to 2 years. Ongoing high blood pressure should be treated with medicines if weight loss and exercise do not work.  If you are 37-12 years old, ask your health care provider if you should take aspirin to prevent strokes.  Diabetes screening is done by taking a blood sample to check your blood glucose level after you have not eaten for a certain period of time (fasting). If you are not overweight and you do not have risk factors for diabetes, you should be screened once every 3 years starting at age 41. If you are overweight or obese and you are 76-6 years of age, you should be screened for diabetes every year as part of your cardiovascular risk assessment.  Breast cancer screening is essential preventive care for women. You should practice "breast self-awareness." This means understanding the normal appearance and feel of your breasts and may include breast self-examination. Any changes detected, no matter how small, should be reported to a health care provider. Women in their 66s and 30s should have a clinical breast exam (CBE) by a health care provider as part of a regular health exam every 1 to 3 years. After age 37, women should have a CBE every year. Starting at age 27, women should consider having a mammogram (breast X-ray test) every year. Women who have a family history of breast cancer should talk to their health care provider about genetic screening. Women at a high risk of breast cancer should talk to their health care providers about having an MRI and a  mammogram every year.  Breast cancer gene (BRCA)-related cancer risk assessment is recommended for women who have family members with BRCA-related cancers. BRCA-related cancers include breast, ovarian, tubal, and peritoneal cancers. Having family members with these cancers may be associated with an increased risk for harmful changes (mutations) in the breast cancer genes BRCA1 and BRCA2. Results of the assessment will determine the need for genetic counseling and BRCA1 and BRCA2 testing.  Your health care provider may recommend that you be screened regularly for cancer of the pelvic organs (ovaries, uterus, and vagina). This screening involves a pelvic examination, including checking for microscopic changes to the surface of your cervix (Pap test). You may be encouraged to have this screening done every 3 years, beginning at age 47.  For women ages 33-65, health care providers may recommend pelvic exams and Pap testing every 3 years, or they may recommend the Pap and pelvic exam, combined  with testing for human papilloma virus (HPV), every 5 years. Some types of HPV increase your risk of cervical cancer. Testing for HPV may also be done on women of any age with unclear Pap test results.  Other health care providers may not recommend any screening for nonpregnant women who are considered low risk for pelvic cancer and who do not have symptoms. Ask your health care provider if a screening pelvic exam is right for you.  If you have had past treatment for cervical cancer or a condition that could lead to cancer, you need Pap tests and screening for cancer for at least 20 years after your treatment. If Pap tests have been discontinued, your risk factors (such as having a new sexual partner) need to be reassessed to determine if screening should resume. Some women have medical problems that increase the chance of getting cervical cancer. In these cases, your health care provider may recommend more frequent  screening and Pap tests.  Colorectal cancer can be detected and often prevented. Most routine colorectal cancer screening begins at the age of 21 years and continues through age 37 years. However, your health care provider may recommend screening at an earlier age if you have risk factors for colon cancer. On a yearly basis, your health care provider may provide home test kits to check for hidden blood in the stool. Use of a small camera at the end of a tube, to directly examine the colon (sigmoidoscopy or colonoscopy), can detect the earliest forms of colorectal cancer. Talk to your health care provider about this at age 30, when routine screening begins. Direct exam of the colon should be repeated every 5-10 years through age 80 years, unless early forms of precancerous polyps or small growths are found.  People who are at an increased risk for hepatitis B should be screened for this virus. You are considered at high risk for hepatitis B if:  You were born in a country where hepatitis B occurs often. Talk with your health care provider about which countries are considered high risk.  Your parents were born in a high-risk country and you have not received a shot to protect against hepatitis B (hepatitis B vaccine).  You have HIV or AIDS.  You use needles to inject street drugs.  You live with, or have sex with, someone who has hepatitis B.  You get hemodialysis treatment.  You take certain medicines for conditions like cancer, organ transplantation, and autoimmune conditions.  Hepatitis C blood testing is recommended for all people born from [birth year] through 1965 and any individual with known risks for hepatitis C.  Practice safe sex. Use condoms and avoid high-risk sexual practices to reduce the spread of sexually transmitted infections (STIs). STIs include gonorrhea, chlamydia, syphilis, trichomonas, herpes, HPV, and human immunodeficiency virus (HIV). Herpes, HIV, and HPV are viral illnesses  that have no cure. They can result in disability, cancer, and death.  You should be screened for sexually transmitted illnesses (STIs) including gonorrhea and chlamydia if:  You are sexually active and are younger than 24 years.  You are older than 24 years and your health care provider tells you that you are at risk for this type of infection.  Your sexual activity has changed since you were last screened and you are at an increased risk for chlamydia or gonorrhea. Ask your health care provider if you are at risk.  If you are at risk of being infected with HIV, it is recommended that you  take a prescription medicine daily to prevent HIV infection. This is called preexposure prophylaxis (PrEP). You are considered at risk if:  You are sexually active and do not regularly use condoms or know the HIV status of your partner(s).  You take drugs by injection.  You are sexually active with a partner who has HIV.  Talk with your health care provider about whether you are at high risk of being infected with HIV. If you choose to begin PrEP, you should first be tested for HIV. You should then be tested every 3 months for as long as you are taking PrEP.  Osteoporosis is a disease in which the bones lose minerals and strength with aging. This can result in serious bone fractures or breaks. The risk of osteoporosis can be identified using a bone density scan. Women ages 57 years and over and women at risk for fractures or osteoporosis should discuss screening with their health care providers. Ask your health care provider whether you should take a calcium supplement or vitamin D to reduce the rate of osteoporosis.  Menopause can be associated with physical symptoms and risks. Hormone replacement therapy is available to decrease symptoms and risks. You should talk to your health care provider about whether hormone replacement therapy is right for you.  Use sunscreen. Apply sunscreen liberally and  repeatedly throughout the day. You should seek shade when your shadow is shorter than you. Protect yourself by wearing long sleeves, pants, a wide-brimmed hat, and sunglasses year round, whenever you are outdoors.  Once a month, do a whole body skin exam, using a mirror to look at the skin on your back. Tell your health care provider of new moles, moles that have irregular borders, moles that are larger than a pencil eraser, or moles that have changed in shape or color.  Stay current with required vaccines (immunizations).  Influenza vaccine. All adults should be immunized every year.  Tetanus, diphtheria, and acellular pertussis (Td, Tdap) vaccine. Pregnant women should receive 1 dose of Tdap vaccine during each pregnancy. The dose should be obtained regardless of the length of time since the last dose. Immunization is preferred during the 27th-36th week of gestation. An adult who has not previously received Tdap or who does not know her vaccine status should receive 1 dose of Tdap. This initial dose should be followed by tetanus and diphtheria toxoids (Td) booster doses every 10 years. Adults with an unknown or incomplete history of completing a 3-dose immunization series with Td-containing vaccines should begin or complete a primary immunization series including a Tdap dose. Adults should receive a Td booster every 10 years.  Varicella vaccine. An adult without evidence of immunity to varicella should receive 2 doses or a second dose if she has previously received 1 dose. Pregnant females who do not have evidence of immunity should receive the first dose after pregnancy. This first dose should be obtained before leaving the health care facility. The second dose should be obtained 4-8 weeks after the first dose.  Human papillomavirus (HPV) vaccine. Females aged 13-26 years who have not received the vaccine previously should obtain the 3-dose series. The vaccine is not recommended for use in pregnant  females. However, pregnancy testing is not needed before receiving a dose. If a female is found to be pregnant after receiving a dose, no treatment is needed. In that case, the remaining doses should be delayed until after the pregnancy. Immunization is recommended for any person with an immunocompromised condition through the  age of 80 years if she did not get any or all doses earlier. During the 3-dose series, the second dose should be obtained 4-8 weeks after the first dose. The third dose should be obtained 24 weeks after the first dose and 16 weeks after the second dose.  Zoster vaccine. One dose is recommended for adults aged 81 years or older unless certain conditions are present.  Measles, mumps, and rubella (MMR) vaccine. Adults born before [birth year] generally are considered immune to measles and mumps. Adults born in [birth year] or later should have 1 or more doses of MMR vaccine unless there is a contraindication to the vaccine or there is laboratory evidence of immunity to each of the three diseases. A routine second dose of MMR vaccine should be obtained at least 28 days after the first dose for students attending postsecondary schools, health care workers, or international travelers. People who received inactivated measles vaccine or an unknown type of measles vaccine during 1963-1967 should receive 2 doses of MMR vaccine. People who received inactivated mumps vaccine or an unknown type of mumps vaccine before 1979 and are at high risk for mumps infection should consider immunization with 2 doses of MMR vaccine. For females of childbearing age, rubella immunity should be determined. If there is no evidence of immunity, females who are not pregnant should be vaccinated. If there is no evidence of immunity, females who are pregnant should delay immunization until after pregnancy. Unvaccinated health care workers born before [birth year] who lack laboratory evidence of measles, mumps, or rubella immunity or laboratory  confirmation of disease should consider measles and mumps immunization with 2 doses of MMR vaccine or rubella immunization with 1 dose of MMR vaccine.  Pneumococcal 13-valent conjugate (PCV13) vaccine. When indicated, a person who is uncertain of his immunization history and has no record of immunization should receive the PCV13 vaccine. All adults 32 years of age and older should receive this vaccine. An adult aged 74 years or older who has certain medical conditions and has not been previously immunized should receive 1 dose of PCV13 vaccine. This PCV13 should be followed with a dose of pneumococcal polysaccharide (PPSV23) vaccine. Adults who are at high risk for pneumococcal disease should obtain the PPSV23 vaccine at least 8 weeks after the dose of PCV13 vaccine. Adults older than 43 years of age who have normal immune system function should obtain the PPSV23 vaccine dose at least 1 year after the dose of PCV13 vaccine.  Pneumococcal polysaccharide (PPSV23) vaccine. When PCV13 is also indicated, PCV13 should be obtained first. All adults aged 35 years and older should be immunized. An adult younger than age 56 years who has certain medical conditions should be immunized. Any person who resides in a nursing home or long-term care facility should be immunized. An adult smoker should be immunized. People with an immunocompromised condition and certain other conditions should receive both PCV13 and PPSV23 vaccines. People with human immunodeficiency virus (HIV) infection should be immunized as soon as possible after diagnosis. Immunization during chemotherapy or radiation therapy should be avoided. Routine use of PPSV23 vaccine is not recommended for American Indians, [place]atives, or people younger than 65 years unless there are medical conditions that require PPSV23 vaccine. When indicated, people who have unknown immunization and have no record of immunization should receive PPSV23 vaccine. One-time  revaccination 5 years after the first dose of PPSV23 is recommended for people aged 19-64 years who have chronic kidney failure, nephrotic syndrome, asplenia, or immunocompromised conditions. People  who received 1-2 doses of PPSV23 before age 22 years should receive another dose of PPSV23 vaccine at age 6 years or later if at least 5 years have passed since the previous dose. Doses of PPSV23 are not needed for people immunized with PPSV23 at or after age 15 years.  Meningococcal vaccine. Adults with asplenia or persistent complement component deficiencies should receive 2 doses of quadrivalent meningococcal conjugate (MenACWY-D) vaccine. The doses should be obtained at least 2 months apart. Microbiologists working with certain meningococcal bacteria, [employer] recruits, people at risk during an outbreak, and people who travel to or live in countries with a high rate of meningitis should be immunized. A first-year college student up through age 59 years who is living in a residence hall should receive a dose if she did not receive a dose on or after her 16th birthday. Adults who have certain high-risk conditions should receive one or more doses of vaccine.  Hepatitis A vaccine. Adults who wish to be protected from this disease, have certain high-risk conditions, work with hepatitis A-infected animals, work in hepatitis A research labs, or travel to or work in countries with a high rate of hepatitis A should be immunized. Adults who were previously unvaccinated and who anticipate close contact with an international adoptee during the first 60 days after arrival in the [place] from a country with a high rate of hepatitis A should be immunized.  Hepatitis B vaccine. Adults who wish to be protected from this disease, have certain high-risk conditions, may be exposed to blood or other infectious body fluids, are household contacts or sex partners of hepatitis B positive people, are clients or workers in  certain care facilities, or travel to or work in countries with a high rate of hepatitis B should be immunized.  Haemophilus influenzae type b (Hib) vaccine. A previously unvaccinated person with asplenia or sickle cell disease or having a scheduled splenectomy should receive 1 dose of Hib vaccine. Regardless of previous immunization, a recipient of a hematopoietic stem cell transplant should receive a 3-dose series 6-12 months after her successful transplant. Hib vaccine is not recommended for adults with HIV infection. Preventive Services / Frequency Ages 82 to 57 years  Blood pressure check.** / Every 3-5 years.  Lipid and cholesterol check.** / Every 5 years beginning at age 80.  Clinical breast exam.** / Every 3 years for women in their 40s and 44s.  BRCA-related cancer risk assessment.** / For women who have family members with a BRCA-related cancer (breast, ovarian, tubal, or peritoneal cancers).  Pap test.** / Every 2 years from ages 28 through 27. Every 3 years starting at age 70 through age 65 or 44 with a history of 3 consecutive normal Pap tests.  HPV screening.** / Every 3 years from ages 57 through ages 61 to 64 with a history of 3 consecutive normal Pap tests.  Hepatitis C blood test.** / For any individual with known risks for hepatitis C.  Skin self-exam. / Monthly.  Influenza vaccine. / Every year.  Tetanus, diphtheria, and acellular pertussis (Tdap, Td) vaccine.** / Consult your health care provider. Pregnant women should receive 1 dose of Tdap vaccine during each pregnancy. 1 dose of Td every 10 years.  Varicella vaccine.** / Consult your health care provider. Pregnant females who do not have evidence of immunity should receive the first dose after pregnancy.  HPV vaccine. / 3 doses over 6 months, if 5 and younger. The vaccine is not recommended for use in  pregnant females. However, pregnancy testing is not needed before receiving a dose.  Measles, mumps, rubella  (MMR) vaccine.** / You need at least 1 dose of MMR if you were born in 1957 or later. You may also need a 2nd dose. For females of childbearing age, rubella immunity should be determined. If there is no evidence of immunity, females who are not pregnant should be vaccinated. If there is no evidence of immunity, females who are pregnant should delay immunization until after pregnancy.  Pneumococcal 13-valent conjugate (PCV13) vaccine.** / Consult your health care provider.  Pneumococcal polysaccharide (PPSV23) vaccine.** / 1 to 2 doses if you smoke cigarettes or if you have certain conditions.  Meningococcal vaccine.** / 1 dose if you are age 28 to 75 years and a Market researcher living in a residence hall, or have one of several medical conditions, you need to get vaccinated against meningococcal disease. You may also need additional booster doses.  Hepatitis A vaccine.** / Consult your health care provider.  Hepatitis B vaccine.** / Consult your health care provider.  Haemophilus influenzae type b (Hib) vaccine.** / Consult your health care provider. Ages 32 to 57 years  Blood pressure check.** / Every year.  Lipid and cholesterol check.** / Every 5 years beginning at age 52 years.  Lung cancer screening. / Every year if you are aged 37-80 years and have a 30-pack-year history of smoking and currently smoke or have quit within the past 15 years. Yearly screening is stopped once you have quit smoking for at least 15 years or develop a health problem that would prevent you from having lung cancer treatment.  Clinical breast exam.** / Every year after age 45 years.  BRCA-related cancer risk assessment.** / For women who have family members with a BRCA-related cancer (breast, ovarian, tubal, or peritoneal cancers).  Mammogram.** / Every year beginning at age 61 years and continuing for as long as you are in good health. Consult with your health care provider.  Pap test.** / Every 3  years starting at age 45 years through age 11 or 32 years with a history of 3 consecutive normal Pap tests.  HPV screening.** / Every 3 years from ages 75 years through ages 65 to 21 years with a history of 3 consecutive normal Pap tests.  Fecal occult blood test (FOBT) of stool. / Every year beginning at age 16 years and continuing until age 39 years. You may not need to do this test if you get a colonoscopy every 10 years.  Flexible sigmoidoscopy or colonoscopy.** / Every 5 years for a flexible sigmoidoscopy or every 10 years for a colonoscopy beginning at age 49 years and continuing until age 46 years.  Hepatitis C blood test.** / For all people born from [birth year] through 1965 and any individual with known risks for hepatitis C.  Skin self-exam. / Monthly.  Influenza vaccine. / Every year.  Tetanus, diphtheria, and acellular pertussis (Tdap/Td) vaccine.** / Consult your health care provider. Pregnant women should receive 1 dose of Tdap vaccine during each pregnancy. 1 dose of Td every 10 years.  Varicella vaccine.** / Consult your health care provider. Pregnant females who do not have evidence of immunity should receive the first dose after pregnancy.  Zoster vaccine.** / 1 dose for adults aged 3 years or older.  Measles, mumps, rubella (MMR) vaccine.** / You need at least 1 dose of MMR if you were born in 1957 or later. You may also need a second dose. For  females of childbearing age, rubella immunity should be determined. If there is no evidence of immunity, females who are not pregnant should be vaccinated. If there is no evidence of immunity, females who are pregnant should delay immunization until after pregnancy.  Pneumococcal 13-valent conjugate (PCV13) vaccine.** / Consult your health care provider.  Pneumococcal polysaccharide (PPSV23) vaccine.** / 1 to 2 doses if you smoke cigarettes or if you have certain conditions.  Meningococcal vaccine.** / Consult your health care  provider.  Hepatitis A vaccine.** / Consult your health care provider.  Hepatitis B vaccine.** / Consult your health care provider.  Haemophilus influenzae type b (Hib) vaccine.** / Consult your health care provider. Ages 55 years and over  Blood pressure check.** / Every year.  Lipid and cholesterol check.** / Every 5 years beginning at age 78 years.  Lung cancer screening. / Every year if you are aged 29-80 years and have a 30-pack-year history of smoking and currently smoke or have quit within the past 15 years. Yearly screening is stopped once you have quit smoking for at least 15 years or develop a health problem that would prevent you from having lung cancer treatment.  Clinical breast exam.** / Every year after age 73 years.  BRCA-related cancer risk assessment.** / For women who have family members with a BRCA-related cancer (breast, ovarian, tubal, or peritoneal cancers).  Mammogram.** / Every year beginning at age 102 years and continuing for as long as you are in good health. Consult with your health care provider.  Pap test.** / Every 3 years starting at age 19 years through age 6 or 55 years with 3 consecutive normal Pap tests. Testing can be stopped between 65 and 70 years with 3 consecutive normal Pap tests and no abnormal Pap or HPV tests in the past 10 years.  HPV screening.** / Every 3 years from ages 96 years through ages 70 or 27 years with a history of 3 consecutive normal Pap tests. Testing can be stopped between 65 and 70 years with 3 consecutive normal Pap tests and no abnormal Pap or HPV tests in the past 10 years.  Fecal occult blood test (FOBT) of stool. / Every year beginning at age 26 years and continuing until age 59 years. You may not need to do this test if you get a colonoscopy every 10 years.  Flexible sigmoidoscopy or colonoscopy.** / Every 5 years for a flexible sigmoidoscopy or every 10 years for a colonoscopy beginning at age 37 years and continuing  until age 19 years.  Hepatitis C blood test.** / For all people born from [birth year] through 1963 and any individual with known risks for hepatitis C.  Osteoporosis screening.** / A one-time screening for women ages 56 years and over and women at risk for fractures or osteoporosis.  Skin self-exam. / Monthly.  Influenza vaccine. / Every year.  Tetanus, diphtheria, and acellular pertussis (Tdap/Td) vaccine.** / 1 dose of Td every 10 years.  Varicella vaccine.** / Consult your health care provider.  Zoster vaccine.** / 1 dose for adults aged 42 years or older.  Pneumococcal 13-valent conjugate (PCV13) vaccine.** / Consult your health care provider.  Pneumococcal polysaccharide (PPSV23) vaccine.** / 1 dose for all adults aged 63 years and older.  Meningococcal vaccine.** / Consult your health care provider.  Hepatitis A vaccine.** / Consult your health care provider.  Hepatitis B vaccine.** / Consult your health care provider.  Haemophilus influenzae type b (Hib) vaccine.** / Consult your health care provider. **  Family history and personal history of risk and conditions may change your health care provider's recommendations.   This information is not intended to replace advice given to you by your health care provider. Make sure you discuss any questions you have with your health care provider.   Document Released: 08/21/2001 Document Revised: 07/16/2014 Document Reviewed: 11/20/2010 Elsevier Interactive Patient Education [employer].

## 2016-01-04 ENCOUNTER — Ambulatory Visit (HOSPITAL_BASED_OUTPATIENT_CLINIC_OR_DEPARTMENT_OTHER)
Admission: RE | Admit: 2016-01-04 | Discharge: 2016-01-04 | Disposition: A | Discharge status: Home / Self Care | Payer: 59 | Source: Ambulatory Visit | Attending: Medical | Admitting: Medical

## 2016-01-04 ENCOUNTER — Encounter: Payer: Self-pay | Admitting: Medical

## 2016-01-04 ENCOUNTER — Ambulatory Visit (INDEPENDENT_AMBULATORY_CARE_PROVIDER_SITE_OTHER): Payer: 59 | Admitting: Medical

## 2016-01-04 VITALS — BP 100/78 | HR 71 | Temp 98.0°F | Ht 69.0 in | Wt 143.8 lb

## 2016-01-04 DIAGNOSIS — M542 Cervicalgia: Secondary | ICD-10-CM

## 2016-01-04 DIAGNOSIS — R51 Headache: Secondary | ICD-10-CM

## 2016-01-04 DIAGNOSIS — S0990XA Unspecified injury of head, initial encounter: Secondary | ICD-10-CM | POA: Diagnosis not present

## 2016-01-04 DIAGNOSIS — F05 Delirium due to known physiological condition: Secondary | ICD-10-CM

## 2016-01-04 DIAGNOSIS — S060X0A Concussion without loss of consciousness, initial encounter: Secondary | ICD-10-CM

## 2016-01-04 DIAGNOSIS — R519 Headache, unspecified: Secondary | ICD-10-CM

## 2016-01-04 MED ORDER — CYCLOBENZAPRINE HCL 10 MG PO TABS: 10.0000 mg | ORAL_TABLET | Freq: Every day | ORAL | Status: DC

## 2016-01-04 MED ORDER — DICLOFENAC SODIUM 75 MG PO TBEC: 75.0000 mg | DELAYED_RELEASE_TABLET | Freq: Two times a day (BID) | ORAL | Status: DC

## 2016-01-04 NOTE — Patient Instructions (Addendum)
We will get CT of the head stat today and cspine xray as well. Please stay in radiology until we discuss results.  Rx diclofenac for pain and flexeril to relax the neck muscles.   You likely do have a concussion. I will write you off work for one week. Follow up with me to determine if can go back. Rest brain and avoid any recurrent trauma.(avoid post concussion syndrome).  If not recovered by one week refer to neurologist.  If signs and symptoms worsen as discussed despite negative CT then ED evaluation at [facility]. In that event MRI may be needed  Follow up in 1 wk or as needed  Concussion, Adult A concussion, or closed-head injury, is a brain injury caused by a direct blow to the head or by a quick and sudden movement (jolt) of the head or neck. Concussions are usually not life-threatening. Even so, the effects of a concussion can be serious. If you have had a concussion before, you are more likely to experience concussion-like symptoms after a direct blow to the head.  CAUSES  Direct blow to the head, such as from running into another player during a soccer game, being hit in a fight, or hitting your head on a hard surface.  A jolt of the head or neck that causes the brain to move back and forth inside the skull, such as in a car crash. SIGNS AND SYMPTOMS The signs of a concussion can be hard to notice. Early on, they may be missed by you, family members, and health care providers. You may look fine but act or feel differently. Symptoms are usually temporary, but they may last for days, weeks, or even longer. Some symptoms may appear right away while others may not show up for hours or days. Every head injury is different. Symptoms include:  Mild to moderate headaches that will not go away.  A feeling of pressure inside your head.  Having more trouble than usual:  Learning or remembering things you have heard.  Answering questions.  Paying attention or  concentrating.  Organizing daily tasks.  Making decisions and solving problems.  Slowness in thinking, acting or reacting, speaking, or reading.  Getting lost or being easily confused.  Feeling tired all the time or lacking energy (fatigued).  Feeling drowsy.  Sleep disturbances.  Sleeping more than usual.  Sleeping less than usual.  Trouble falling asleep.  Trouble sleeping (insomnia).  Loss of balance or feeling lightheaded or dizzy.  Nausea or vomiting.  Numbness or tingling.  Increased sensitivity to:  Sounds.  Lights.  Distractions.  Vision problems or eyes that tire easily.  Diminished sense of taste or smell.  Ringing in the ears.  Mood changes such as feeling sad or anxious.  Becoming easily irritated or angry for little or no reason.  Lack of motivation.  Seeing or hearing things other people do not see or hear (hallucinations). DIAGNOSIS Your health care provider can usually diagnose a concussion based on a description of your injury and symptoms. He or she will ask whether you passed out (lost consciousness) and whether you are having trouble remembering events that happened right before and during your injury. Your evaluation might include:  A brain scan to look for signs of injury to the brain. Even if the test shows no injury, you may still have a concussion.  Blood tests to be sure other problems are not present. TREATMENT  Concussions are usually treated in an emergency department, in urgent  care, or at a clinic. You may need to stay in the hospital overnight for further treatment.  Tell your health care provider if you are taking any medicines, including prescription medicines, over-the-counter medicines, and natural remedies. Some medicines, such as blood thinners (anticoagulants) and aspirin, may increase the chance of complications. Also tell your health care provider whether you have had alcohol or are taking illegal drugs. This  information may affect treatment.  Your health care provider will send you home with important instructions to follow.  How fast you will recover from a concussion depends on many factors. These factors include how severe your concussion is, what part of your brain was injured, your age, and how healthy you were before the concussion.  Most people with mild injuries recover fully. Recovery can take time. In general, recovery is slower in older persons. Also, persons who have had a concussion in the past or have other medical problems may find that it takes longer to recover from their current injury. HOME CARE INSTRUCTIONS General Instructions  Carefully follow the directions your health care provider gave you.  Only take over-the-counter or prescription medicines for pain, discomfort, or fever as directed by your health care provider.  Take only those medicines that your health care provider has approved.  Do not drink alcohol until your health care provider says you are well enough to do so. Alcohol and certain other drugs may slow your recovery and can put you at risk of further injury.  If it is harder than usual to remember things, write them down.  If you are easily distracted, try to do one thing at a time. For example, do not try to watch TV while fixing dinner.  Talk with family members or close friends when making important decisions.  Keep all follow-up appointments. Repeated evaluation of your symptoms is recommended for your recovery.  Watch your symptoms and tell others to do the same. Complications sometimes occur after a concussion. Older adults with a brain injury may have a higher risk of serious complications, such as a blood clot on the brain.  Tell your teachers, school nurse, school counselor, coach, athletic trainer, or work Freight forwarder about your injury, symptoms, and restrictions. Tell them about what you can or cannot do. They should watch for:  Increased problems  with attention or concentration.  Increased difficulty remembering or learning new information.  Increased time needed to complete tasks or assignments.  Increased irritability or decreased ability to cope with stress.  Increased symptoms.  Rest. Rest helps the brain to heal. Make sure you:  Get plenty of sleep at night. Avoid staying up late at night.  Keep the same bedtime hours on weekends and weekdays.  Rest during the day. Take daytime naps or rest breaks when you feel tired.  Limit activities that require a lot of thought or concentration. These include:  Doing homework or job-related work.  Watching TV.  Working on the computer.  Avoid any situation where there is potential for another head injury (football, hockey, soccer, basketball, martial arts, downhill snow sports and horseback riding). Your condition will get worse every time you experience a concussion. You should avoid these activities until you are evaluated by the appropriate follow-up health care providers. Returning To Your Regular Activities You will need to return to your normal activities slowly, not all at once. You must give your body and brain enough time for recovery.  Do not return to sports or other athletic activities until your  health care provider tells you it is safe to do so.  Ask your health care provider when you can drive, ride a bicycle, or operate heavy machinery. Your ability to react may be slower after a brain injury. Never do these activities if you are dizzy.  Ask your health care provider about when you can return to work or school. Preventing Another Concussion It is very important to avoid another brain injury, especially before you have recovered. In rare cases, another injury can lead to permanent brain damage, brain swelling, or death. The risk of this is greatest during the first 7-10 days after a head injury. Avoid injuries by:  Wearing a seat belt when riding in a  car.  Drinking alcohol only in moderation.  Wearing a helmet when biking, skiing, skateboarding, skating, or doing similar activities.  Avoiding activities that could lead to a second concussion, such as contact or recreational sports, until your health care provider says it is okay.  Taking safety measures in your home.  Remove clutter and tripping hazards from floors and stairways.  Use grab bars in bathrooms and handrails by stairs.  Place non-slip mats on floors and in bathtubs.  Improve lighting in dim areas. SEEK MEDICAL CARE IF:  You have increased problems paying attention or concentrating.  You have increased difficulty remembering or learning new information.  You need more time to complete tasks or assignments than before.  You have increased irritability or decreased ability to cope with stress.  You have more symptoms than before. Seek medical care if you have any of the following symptoms for more than 2 weeks after your injury:  Lasting (chronic) headaches.  Dizziness or balance problems.  Nausea.  Vision problems.  Increased sensitivity to noise or light.  Depression or mood swings.  Anxiety or irritability.  Memory problems.  Difficulty concentrating or paying attention.  Sleep problems.  Feeling tired all the time. SEEK IMMEDIATE MEDICAL CARE IF:  You have severe or worsening headaches. These may be a sign of a blood clot in the brain.  You have weakness (even if only in one hand, leg, or part of the face).  You have numbness.  You have decreased coordination.  You vomit repeatedly.  You have increased sleepiness.  One pupil is larger than the other.  You have convulsions.  You have slurred speech.  You have increased confusion. This may be a sign of a blood clot in the brain.  You have increased restlessness, agitation, or irritability.  You are unable to recognize people or places.  You have neck pain.  It is difficult  to wake you up.  You have unusual behavior changes.  You lose consciousness. MAKE SURE YOU:  Understand these instructions.  Will watch your condition.  Will get help right away if you are not doing well or get worse.   This information is not intended to replace advice given to you by your health care provider. Make sure you discuss any questions you have with your health care provider.   Document Released: 09/15/2003 Document Revised: 07/16/2014 Document Reviewed: 01/15/2013 Elsevier Interactive Patient Education [employer].
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
## 2016-01-04 NOTE — Progress Notes (Signed)
Pre visit review using our clinic review tool, if applicable. No additional management support is needed unless otherwise documented below in the visit note. 

## 2016-01-06 ENCOUNTER — Telehealth: Payer: Self-pay | Admitting: *Deleted

## 2016-01-06 MED ORDER — CEFDINIR 300 MG PO CAPS: 300.0000 mg | ORAL_CAPSULE | Freq: Two times a day (BID) | ORAL | Status: DC

## 2016-01-06 NOTE — Telephone Encounter (Signed)
-----   Message from Mosie Lukes, MD sent at 01/06/2016  5:08 PM EDT ----- Notify CT of head negative but there is some air fluid levels in left sinus suggestive of sinusitis, start Cefdinir 300 mg po bid x 2 week

## 2016-01-06 NOTE — Telephone Encounter (Signed)
Patient informed, understood & agreed; Rx to pharmacy/[facility] 06/30

## 2016-01-11 ENCOUNTER — Ambulatory Visit (INDEPENDENT_AMBULATORY_CARE_PROVIDER_SITE_OTHER): Payer: 59 | Admitting: Physician Assistant

## 2016-01-11 ENCOUNTER — Encounter: Payer: Self-pay | Admitting: Physician Assistant

## 2016-01-11 VITALS — BP 96/52 | HR 83 | Temp 98.0°F | Resp 16 | Ht 69.0 in | Wt 144.2 lb

## 2016-01-11 DIAGNOSIS — M542 Cervicalgia: Secondary | ICD-10-CM | POA: Diagnosis not present

## 2016-01-11 DIAGNOSIS — F0781 Postconcussional syndrome: Secondary | ICD-10-CM

## 2016-01-11 DIAGNOSIS — J329 Chronic sinusitis, unspecified: Secondary | ICD-10-CM | POA: Diagnosis not present

## 2016-01-11 NOTE — Patient Instructions (Signed)
Please stay hydrated, eat a well-balanced diet and get plenty of rest. You have been staying active which is likely why symptoms are persisting.  This is waht we call post-concussion syndrome and can last a few weeks. Your neurological exam is normal today which is a great finding. I want you to rest the rest of the week and through the weekend. My homework for you is for you to take a small midday nap to help your brain rest. Do not focus on mentally taxing tasks.   Alternate tylenol and ibuprofen for neck pain. Apply topical Icy Hot or Aspercreme to the area. Heating pad or chiropractor may be beneficial.  Please finish entire course of antibiotics for sinusitis. Follow-up with me in 1.5 weeks.  If anything changes, please let me know.
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
## 2016-01-16 ENCOUNTER — Telehealth: Payer: Self-pay | Admitting: Physician Assistant

## 2016-01-16 MED ORDER — FLUCONAZOLE 150 MG PO TABS: 150.0000 mg | ORAL_TABLET | Freq: Once | ORAL | Status: DC

## 2016-01-16 NOTE — Telephone Encounter (Signed)
LVM advising patient below.

## 2016-01-16 NOTE — Telephone Encounter (Signed)
°  Relation to EX:NTZG Call back number: [phone]  Pharmacy: [id number] [place], [phone](Phone) [phone](Fax)         Reason for call:  Patient states cefdinir (OMNICEF) 300 MG capsule caused a yeast infecting, burning and itching. Patient requesting 2 rounds antibitotics patient states it takes 2 rounds to completely get of rid of yeast infection. Please advise

## 2016-01-16 NOTE — Telephone Encounter (Signed)
I have sent in Diflucan 151m tablet. Take 1 tablet by mouth once. Repeat in 3 days.

## 2016-11-14 ENCOUNTER — Ambulatory Visit (INDEPENDENT_AMBULATORY_CARE_PROVIDER_SITE_OTHER): Payer: 59 | Admitting: Podiatry

## 2016-11-14 ENCOUNTER — Ambulatory Visit (INDEPENDENT_AMBULATORY_CARE_PROVIDER_SITE_OTHER): Payer: 59

## 2016-11-14 ENCOUNTER — Encounter: Payer: Self-pay | Admitting: Podiatry

## 2016-11-14 VITALS — Ht 69.0 in | Wt 143.0 lb

## 2016-11-14 DIAGNOSIS — M21622 Bunionette of left foot: Secondary | ICD-10-CM | POA: Diagnosis not present

## 2016-11-14 DIAGNOSIS — M2010 Hallux valgus (acquired), unspecified foot: Secondary | ICD-10-CM | POA: Diagnosis not present

## 2016-11-14 DIAGNOSIS — M21621 Bunionette of right foot: Secondary | ICD-10-CM | POA: Diagnosis not present

## 2016-11-14 NOTE — Progress Notes (Signed)
   Subjective:    Patient ID: [name], female    DOB: 1971/03/18, 46 y.o.   MRN: [id number]  HPI  Chief Complaint  Patient presents with  . Foot Pain    Bilateral; bunions; pt stated, "Left foot hurts more; wants to discuss what options they have"     Review of Systems  HENT: Positive for sinus pain.   Musculoskeletal: Positive for back pain.  All other systems reviewed and are negative.      Objective:   Physical Exam        Assessment & Plan:

## 2016-11-14 NOTE — Patient Instructions (Signed)
Bunionectomy A bunionectomy is a surgical procedure to remove a bunion. A bunion is a visible bump of bone on the inside of your foot where your big toe meets the rest of your foot. A bunion can develop when pressure turns this bone (first metatarsal) toward the other toes. Shoes that are too tight are the most common cause of bunions. Bunions can also be caused by diseases, such as arthritis and polio. You may need a bunionectomy if your bunion is very large and painful or it affects your ability to walk. Tell a health care provider about:  Any allergies you have.  All medicines you are taking, including vitamins, herbs, eye drops, creams, and over-the-counter medicines.  Any problems you or family members have had with anesthetic medicines.  Any blood disorders you have.  Any surgeries you have had.  Any medical conditions you have. What are the risks? Generally, this is a safe procedure. However, problems may occur, including:  Infection.  Pain.  Nerve damage.  Bleeding or blood clots.  Reactions to medicines.  Numbness, stiffness, or arthritis in your toe.  Foot problems that continue even after the procedure. What happens before the procedure?  Ask your health care provider about:  Changing or stopping your regular medicines. This is especially important if you are taking diabetes medicines or blood thinners.  Taking medicines such as aspirin and ibuprofen. These medicines can thin your blood. Do not take these medicines before your procedure if your health care provider instructs you not to.  Do not drink alcohol before the procedure as directed by your health care provider.  Do not use tobacco products, including cigarettes, chewing tobacco, or electronic cigarettes, before the procedure as directed by your health care provider. If you need help quitting, ask your health care provider.  Ask your health care provider what kind of medicine you will be given during  your procedure. A bunionectomy may be done using one of these:  A medicine that numbs the area (local anesthetic).  A medicine that makes you go to sleep (general anesthetic). If you will be given general anesthetic, do not eat or drink anything after midnight on the night before the procedure or as directed by your health care provider. What happens during the procedure?  An IV tube may be inserted into a vein.  You will be given local anesthetic or general anesthetic.  The surgeon will make a cut (incision) over the enlarged area at the first joint of the big toe. The surgeon will remove the bunion.  You may have more than one incision if any of the bones in your big toe need to be moved. A bone itself may need to be cut.  Sometimes the tissues around the big toe may also need to be cut then tightened or loosened to reposition the toe.  Screws or other hardware may be used to keep your foot in thecorrect position.  The incision will be closed with stitches (sutures) and covered with adhesive strips or another type of bandage (dressing). What happens after the procedure?  You may spend some time in a recovery area.  Your blood pressure, heart rate, breathing rate, and blood oxygen level will be monitored often until the medicines you were given have worn off. This information is not intended to replace advice given to you by your health care provider. Make sure you discuss any questions you have with your health care provider. Document Released: 06/08/2005 Document Revised: 12/01/2015 Document Reviewed: 02/10/2014  Elsevier Interactive Patient Education  2017 [employer]vier Inc[employer]  

## 2016-11-14 NOTE — Progress Notes (Signed)
Subjective:    Patient ID: [name], female   DOB: 46 y.o.   MRN: [id number]   HPI patient states she's had a long-term history of structural bunion deformity left over right and also pain in the outside of the foot with discomfort around the fifth metatarsal left over right. Also has significant family history of this and states that there is been numerous people the family that it needed surgery    Review of Systems  All other systems reviewed and are negative.       Objective:  Physical Exam  Cardiovascular: Intact distal pulses.   Musculoskeletal: Normal range of motion.  Neurological: She is alert. Sensory deficit: neurovascular status found to be intact with muscle strength adequate range of motion within normal limits with patient noted to have large hyperostosis medial aspect first metatarsal left over right with redness around the bone and pain when palpated. Pat.  Skin: Skin is warm.  Nursing note and vitals reviewed.  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 I've recommended long-term structural bunion correction Taylor's bunion correction left foot and reviewed this with her at great length. I then went ahead today and I scanned for orthotics to try to provide for better plantar support and gave instructions on a type of orthotic that we'll give her good control both medial and lateral with deep heel seat  X-ray indicates elevation of the intermetatarsal angle left of approximate 16 with elevation of the intermetatarsal angle between the fourth and fifth metatarsals left over right

## 2016-12-05 ENCOUNTER — Other Ambulatory Visit: Payer: 59

## 2016-12-11 ENCOUNTER — Ambulatory Visit: Payer: 59 | Admitting: Orthotics

## 2016-12-11 ENCOUNTER — Other Ambulatory Visit: Payer: 59 | Admitting: Orthotics

## 2016-12-11 NOTE — Progress Notes (Signed)
Patient came in today to p/up functional foot orthotics.   The orthotics were assessed to both fit and function.  The F/O addressed the biomechanical issues/pathologies as intended, offering good longitudinal arch support, proper offloading, and foot support. There weren't any signs of discomfort or irritation.  The F/O fit properly in footwear with minimal trimming/adjustments.

## 2017-05-03 ENCOUNTER — Ambulatory Visit (INDEPENDENT_AMBULATORY_CARE_PROVIDER_SITE_OTHER): Payer: 59 | Admitting: Family Medicine

## 2017-05-03 ENCOUNTER — Encounter: Payer: Self-pay | Admitting: Family Medicine

## 2017-05-03 ENCOUNTER — Telehealth: Payer: Self-pay | Admitting: *Deleted

## 2017-05-03 VITALS — BP 108/62 | HR 73 | Temp 97.8°F | Ht 69.0 in | Wt 143.1 lb

## 2017-05-03 DIAGNOSIS — R05 Cough: Secondary | ICD-10-CM

## 2017-05-03 DIAGNOSIS — J329 Chronic sinusitis, unspecified: Secondary | ICD-10-CM

## 2017-05-03 DIAGNOSIS — R058 Other specified cough: Secondary | ICD-10-CM

## 2017-05-03 MED ORDER — LEVOCETIRIZINE DIHYDROCHLORIDE 5 MG PO TABS: 5.0000 mg | ORAL_TABLET | Freq: Every evening | ORAL | 2 refills | Status: DC

## 2017-05-03 MED ORDER — BENZONATATE 100 MG PO CAPS: 100.0000 mg | ORAL_CAPSULE | Freq: Three times a day (TID) | ORAL | 0 refills | Status: AC | PRN

## 2017-05-03 NOTE — Patient Instructions (Signed)
Continue to push fluids, practice good hand hygiene, and cover your mouth if you cough.  If you start having fevers, shaking or shortness of breath, seek immediate care.  Claritin (loratadine), Allegra (fexofenadine), Zyrtec (cetirizine); these are listed in order from weakest to strongest. Generic, and therefore cheaper, options are in the parentheses.   Flonase (fluticasone); nasal spray that is over the counter. 2 sprays each nostril, once daily. Aim towards the same side eye when you spray.  There are available OTC, and the generic versions, which may be cheaper, are in parentheses. Show this to a pharmacist if you have trouble finding any of these items.

## 2017-05-03 NOTE — Telephone Encounter (Signed)
"  I'm calling to set up a date for my surgery." Have you signed consent forms?  "No, I don't think I have."  You will need to come in to see Dr. Paulla Dolly for a consultation.  "I've already seen him."  You have to sign the consent for surgery, he has to go over the procedures and we have to give you a surgery kit.  Would you like me to transfer you to a scheduler?  "Yes, I would like to schedule that appointment."    I transferred the call to [place] so she could schedule an appointment.

## 2017-05-03 NOTE — Progress Notes (Signed)
Pre visit review using our clinic review tool, if applicable. No additional management support is needed unless otherwise documented below in the visit note. 

## 2017-05-03 NOTE — Progress Notes (Signed)
Chief Complaint  Patient presents with  . Cough    [name] here for lingering cough.  Duration: 2 weeks  Associated symptoms: chronic sinus pain and runny/stuffy nose Denies: itchy watery eyes, ear pain, ear drainage, sore throat, wheezing, shortness of breath, myalgia and rigors/fevers Treatment to date: steroid, Zpak  Sick contacts: Yes  Recurrent sinusitis. She has been using phenylephrine.  No fevers, dental pain, or pus draining from her nose.  ROS:  Const: Denies fevers HEENT: As noted in HPI Lungs: No SOB  Past Medical History:  Diagnosis Date  . Allergy    seasonal  . Chest pain   . Headache   . Hip pain   . History of chicken pox    childhood  . Ovarian cyst 8/14   did rupture    Family History  Problem Relation Age of Onset  . Heart attack Father   . Alcohol abuse Father        recovered  . Healthy Sister   . Cancer Maternal Grandmother        breast  . Healthy Brother        1 of 1  . Supraventricular tachycardia Sister   . Stroke Mother        mini  . Tourette syndrome Son   . Alcohol abuse Paternal Grandmother   . Alcohol abuse Paternal Grandfather   . Prostate cancer Neg Hx   . Colon cancer Neg Hx     BP 108/62 (BP Location: Left Arm, Patient Position: Sitting, Cuff Size: Normal)   Pulse 73   Temp 97.8 F (36.6 C) (Oral)   Ht 5' 9"  (1.753 m)   Wt 143 lb 2 oz (64.9 kg)   SpO2 99%   BMI 21.14 kg/m  General: Awake, alert, appears stated age HEENT: AT, [name], ears patent b/l and TM's neg, nares patent w/o discharge, turbinates are enlarged b/l, max sinuses TTP b/l, pharynx pink and without exudates, MMM Neck: No masses or asymmetry Heart: RRR, no murmurs, no bruits Lungs: CTAB, no accessory muscle use Psych: Age appropriate judgment and insight, normal mood and affect  Post-viral cough syndrome - Plan: benzonatate (TESSALON) 100 MG capsule  Recurrent sinusitis - Plan: levocetirizine (XYZAL) 5 MG tablet  Orders as  above. Discussed above with patient, can last 4-6 weeks post infection. Supportive care.  Recurrent sinusitis suggestive of allergic component, start PO antihistamine. Discussed INCS also.  Continue to push fluids, practice good hand hygiene, cover mouth when coughing. F/u prn- with [name] if still having issues, can discuss Singulair vs allergy referral. If starting to experience fevers, shaking, or shortness of breath, seek immediate care. Pt voiced understanding and agreement to the plan.  Kevin, DO 05/03/17 4:18 PM

## 2017-05-10 ENCOUNTER — Ambulatory Visit (INDEPENDENT_AMBULATORY_CARE_PROVIDER_SITE_OTHER): Payer: 59 | Admitting: Podiatry

## 2017-05-10 ENCOUNTER — Encounter: Payer: Self-pay | Admitting: Podiatry

## 2017-05-10 ENCOUNTER — Ambulatory Visit (INDEPENDENT_AMBULATORY_CARE_PROVIDER_SITE_OTHER): Payer: 59

## 2017-05-10 DIAGNOSIS — M2012 Hallux valgus (acquired), left foot: Secondary | ICD-10-CM

## 2017-05-10 NOTE — Patient Instructions (Signed)
Pre-Operative Instructions  Congratulations, you have decided to take an important step towards improving your quality of life.  You can be assured that the doctors and staff at [facility] will be with you every step of the way.  Here are some important things you should know:  1. Plan to be at the surgery center/hospital at least 1 (one) hour prior to your scheduled time, unless otherwise directed by the surgical center/hospital staff.  You must have a responsible adult accompany you, remain during the surgery and drive you home.  Make sure you have directions to the surgical center/hospital to ensure you arrive on time. 2. If you are having surgery at [facility] or [facility] hospitals, you will need a copy of your medical history and physical form from your family physician within one month prior to the date of surgery. We will give you a form for your primary physician to complete.  3. We make every effort to accommodate the date you request for surgery.  However, there are times where surgery dates or times have to be moved.  We will contact you as soon as possible if a change in schedule is required.   4. No aspirin/ibuprofen for one week before surgery.  If you are on aspirin, any non-steroidal anti-inflammatory medications (Mobic, Aleve, Ibuprofen) should not be taken seven (7) days prior to your surgery.  You make take Tylenol for pain prior to surgery.  5. Medications - If you are taking daily heart and blood pressure medications, seizure, reflux, allergy, asthma, anxiety, pain or diabetes medications, make sure you notify the surgery center/hospital before the day of surgery so they can tell you which medications you should take or avoid the day of surgery. 6. No food or drink after midnight the night before surgery unless directed otherwise by surgical center/hospital staff. 7. No alcoholic beverages 24-hours prior to surgery.  No smoking 24-hours prior or 24-hours after  surgery. 8. Wear loose pants or shorts. They should be loose enough to fit over bandages, boots, and casts. 9. Don't wear slip-on shoes. Sneakers are preferred. 10. Bring your boot with you to the surgery center/hospital.  Also bring crutches or a walker if your physician has prescribed it for you.  If you do not have this equipment, it will be provided for you after surgery. 11. If you have not been contacted by the surgery center/hospital by the day before your surgery, call to confirm the date and time of your surgery. 12. Leave-time from work may vary depending on the type of surgery you have.  Appropriate arrangements should be made prior to surgery with your employer. 13. Prescriptions will be provided immediately following surgery by your doctor.  Fill these as soon as possible after surgery and take the medication as directed. Pain medications will not be refilled on weekends and must be approved by the doctor. 14. Remove nail polish on the operative foot and avoid getting pedicures prior to surgery. 15. Wash the night before surgery.  The night before surgery wash the foot and leg well with water and the antibacterial soap provided. Be sure to pay special attention to beneath the toenails and in between the toes.  Wash for at least three (3) minutes. Rinse thoroughly with water and dry well with a towel.  Perform this wash unless told not to do so by your physician.  Enclosed: 1 Ice pack (please put in freezer the night before surgery)   1 Hibiclens skin cleaner     Pre-op instructions  If you have any questions regarding the instructions, please do not hesitate to call our office.  [place]: [address], [place], [place] [address] -- [phone]  [place]: [address] [place], [place] [address] -- [phone]  [place]: [address]  , [place] [address] -- [phone]  [place]: [address], [address], [place],  [address] -- [phone]  Website:  [email or web address] 

## 2017-05-14 NOTE — Progress Notes (Signed)
Subjective:    Patient ID: [name], female   DOB: 46 y.o.   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 We discussed the causes the x-rays and I reviewed them with her and at this point she wants surgery and I allowed her to read the consent form going over alternative treatments complications with the patient. Patient understands risk and after extensive review signs consent form understanding the told recovery. Can take from 6 months to one year with no long-term guarantees. At this time air fracture walker dispensed for the postoperative period and I want her to get used to it prior to the procedure  X-rays indicate elevation of the intermetatarsal angle and of the intermetatarsal angle between the fourth and fifth metatarsals

## 2017-05-22 ENCOUNTER — Telehealth: Payer: Self-pay | Admitting: *Deleted

## 2017-05-22 NOTE — Telephone Encounter (Signed)
"  Calling in regards to a surgery that's scheduled of December 18.  I was hoping I could move that to December 11.  Please give me a call."

## 2017-05-24 NOTE — Telephone Encounter (Signed)
"  I'm calling about rescheduling my surgery to December 11."  We have you scheduled for December 11 already.  "Okay great, thank you."

## 2017-06-18 ENCOUNTER — Encounter: Payer: Self-pay | Admitting: Podiatry

## 2017-06-18 DIAGNOSIS — M2012 Hallux valgus (acquired), left foot: Secondary | ICD-10-CM | POA: Diagnosis not present

## 2017-06-18 DIAGNOSIS — M21542 Acquired clubfoot, left foot: Secondary | ICD-10-CM | POA: Diagnosis not present

## 2017-06-19 ENCOUNTER — Telehealth: Payer: Self-pay | Admitting: Podiatry

## 2017-06-19 NOTE — Telephone Encounter (Signed)
I spoke with pt and she states she is not certain if the oxycodone is causing the nausea or not, she is taking the pain medication every 4 hours about 30 minutes prior to the oxycodone, is taking 235m Ibuprofen and that is covering the pain until it wears off, then the foot feels tingly and warm. I asked pt if she felt she could manage the pain on 8038mIbuprofen, which would be 4 of the 20025mbuprofen 3 times a day and pt states she would try that. I told her I didn't know if the alternate medication Dr. RegPaulla Dollydered would manage the post op discomfort any better than the 800m57muprofen tid. Pt states understanding and will try the 800mg56mprofen tid.

## 2017-06-19 NOTE — Telephone Encounter (Signed)
This is [name] calling for my wife. She had surgery yesterday and has continued to have pretty significant nausea. She had it last night, this morning, and just recently here. Not sure if that is typical for a certain period of time. She has been taking the antinausea pills. If you would kindly call us back at [phone]. Thank you.

## 2017-06-26 ENCOUNTER — Encounter: Payer: Self-pay | Admitting: Podiatry

## 2017-06-26 ENCOUNTER — Ambulatory Visit (INDEPENDENT_AMBULATORY_CARE_PROVIDER_SITE_OTHER): Payer: 59

## 2017-06-26 ENCOUNTER — Ambulatory Visit (INDEPENDENT_AMBULATORY_CARE_PROVIDER_SITE_OTHER): Payer: 59 | Admitting: Podiatry

## 2017-06-26 VITALS — BP 91/67 | Temp 96.6°F

## 2017-06-26 DIAGNOSIS — M2012 Hallux valgus (acquired), left foot: Secondary | ICD-10-CM

## 2017-06-26 DIAGNOSIS — M2010 Hallux valgus (acquired), unspecified foot: Secondary | ICD-10-CM

## 2017-06-26 DIAGNOSIS — M21621 Bunionette of right foot: Secondary | ICD-10-CM | POA: Diagnosis not present

## 2017-06-26 DIAGNOSIS — M21622 Bunionette of left foot: Secondary | ICD-10-CM

## 2017-06-26 NOTE — Progress Notes (Signed)
Subjective:   Patient ID: [name], female   DOB: 46 y.o.   MRN: [id number]   HPI Patient presents stating my left foot has been doing really well but I did traumatize it and I might have moved on it a little bit   ROS      Objective:  Physical Exam  Neurovascular status intact with patient's left first metatarsal showing rotation that may have been traumatized when she fell on her foot yesterday with excellent clinical alignment good range of motion and no indications of clinical pathology     Assessment:  The first metatarsal left distal where we did the osteotomy has what appears to rotated in a lateral direction with a negative IM angle but I do think there is a very good chance is functional at this position     Plan:  H&P x-ray and condition reviewed with patient and family.  At this point I do think it is stable at this position and it is possible that it eventually may need to be brought back over to medial direction but so far it is functioning well and we will put on a very close watch and I instructed on keeping the toe and over valgus position with bandaging.  Patient will be reevaluated in 3 weeks or earlier if any issues should occur and will continue immobilization compression elevation  X-rays indicate there is transverse plane and frontal plane deformity of the first metatarsal left with moderate tibial sesamoidal peaking but it does appear for the most part there is a congruous joint and I am very hopeful it will functional well at this position with the fifth metatarsal looking good in all fixation in place

## 2017-07-05 DIAGNOSIS — M79676 Pain in unspecified toe(s): Secondary | ICD-10-CM

## 2017-07-17 ENCOUNTER — Ambulatory Visit (INDEPENDENT_AMBULATORY_CARE_PROVIDER_SITE_OTHER): Payer: 59

## 2017-07-17 ENCOUNTER — Ambulatory Visit (INDEPENDENT_AMBULATORY_CARE_PROVIDER_SITE_OTHER): Payer: 59 | Admitting: Podiatry

## 2017-07-17 ENCOUNTER — Encounter: Payer: Self-pay | Admitting: Podiatry

## 2017-07-17 DIAGNOSIS — M2012 Hallux valgus (acquired), left foot: Secondary | ICD-10-CM | POA: Diagnosis not present

## 2017-07-17 DIAGNOSIS — M21622 Bunionette of left foot: Secondary | ICD-10-CM

## 2017-07-17 DIAGNOSIS — M21621 Bunionette of right foot: Secondary | ICD-10-CM | POA: Diagnosis not present

## 2017-07-17 DIAGNOSIS — M2010 Hallux valgus (acquired), unspecified foot: Secondary | ICD-10-CM
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
## 2017-08-09 NOTE — Progress Notes (Signed)
DOS 06/18/17 [place] Bunionectomy w/ fixition, metatarsal osteotomy 5th Lt foot

## 2017-08-14 ENCOUNTER — Ambulatory Visit (INDEPENDENT_AMBULATORY_CARE_PROVIDER_SITE_OTHER): Payer: 59

## 2017-08-14 ENCOUNTER — Ambulatory Visit (INDEPENDENT_AMBULATORY_CARE_PROVIDER_SITE_OTHER): Payer: 59 | Admitting: Podiatry

## 2017-08-14 ENCOUNTER — Encounter: Payer: Self-pay | Admitting: Podiatry

## 2017-08-14 DIAGNOSIS — M2012 Hallux valgus (acquired), left foot: Secondary | ICD-10-CM

## 2017-08-14 DIAGNOSIS — R600 Localized edema: Secondary | ICD-10-CM
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
## 2017-08-18 ENCOUNTER — Other Ambulatory Visit: Payer: Self-pay | Admitting: Family Medicine

## 2017-08-18 DIAGNOSIS — J329 Chronic sinusitis, unspecified: Secondary | ICD-10-CM

## 2017-08-21 ENCOUNTER — Encounter: Payer: Self-pay | Admitting: Podiatry

## 2017-08-21 ENCOUNTER — Ambulatory Visit (INDEPENDENT_AMBULATORY_CARE_PROVIDER_SITE_OTHER): Payer: 59 | Admitting: Podiatry

## 2017-08-21 ENCOUNTER — Ambulatory Visit (INDEPENDENT_AMBULATORY_CARE_PROVIDER_SITE_OTHER): Payer: 59

## 2017-08-21 DIAGNOSIS — M2012 Hallux valgus (acquired), left foot: Secondary | ICD-10-CM | POA: Diagnosis not present

## 2017-08-21 NOTE — Progress Notes (Signed)
Subjective:   Patient ID: [name], female   DOB: 47 y.o.   MRN: [id number]   HPI Patient states seems to be doing better but still having some swelling around the big toe and also the swelling in her foot has reduced quite a bit   ROS      Objective:  Physical Exam  Neurovascular status intact negative Homans sign noted with patient's left foot healing well with wound edges well coapted good range of motion of the first MPJ with no crepitus of the joint surface noted currently     Assessment:  Doing well post osteotomy first metatarsal left fifth metatarsal left with fixation in place with mild varus component secondary to previous traumatic injury with no clinical indications of varus     Plan:  Reviewed H&P and discussed the continuation of chemotherapy which we may do again in the future.  At this time dispensed a medium ankle brace to try to reduce the swelling and advised to continue range of motion exercises gradual return to soft shoe gear reappoint 5 weeks  X-rays indicate that there is mild varus component to the left first metatarsal but it is not getting worse as time progresses with fifth metatarsal healing very well

## 2017-10-02 ENCOUNTER — Encounter: Payer: Self-pay | Admitting: Podiatry

## 2017-10-02 ENCOUNTER — Ambulatory Visit (INDEPENDENT_AMBULATORY_CARE_PROVIDER_SITE_OTHER): Payer: 59 | Admitting: Podiatry

## 2017-10-02 DIAGNOSIS — M2012 Hallux valgus (acquired), left foot: Secondary | ICD-10-CM

## 2017-10-02 DIAGNOSIS — M21621 Bunionette of right foot: Secondary | ICD-10-CM

## 2017-10-02 DIAGNOSIS — M21622 Bunionette of left foot: Secondary | ICD-10-CM
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
## 2017-11-13 ENCOUNTER — Ambulatory Visit: Payer: 59 | Admitting: Podiatry

## 2017-11-18 ENCOUNTER — Encounter: Payer: Self-pay | Admitting: Podiatry

## 2017-11-18 ENCOUNTER — Ambulatory Visit (INDEPENDENT_AMBULATORY_CARE_PROVIDER_SITE_OTHER): Payer: 59 | Admitting: Podiatry

## 2017-11-18 ENCOUNTER — Ambulatory Visit (INDEPENDENT_AMBULATORY_CARE_PROVIDER_SITE_OTHER): Payer: 59

## 2017-11-18 DIAGNOSIS — M2012 Hallux valgus (acquired), left foot: Secondary | ICD-10-CM
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
## 2017-12-16 ENCOUNTER — Ambulatory Visit: Payer: 59 | Admitting: Podiatry

## 2017-12-18 ENCOUNTER — Encounter: Payer: Self-pay | Admitting: Podiatry

## 2017-12-18 ENCOUNTER — Ambulatory Visit (INDEPENDENT_AMBULATORY_CARE_PROVIDER_SITE_OTHER): Payer: 59

## 2017-12-18 ENCOUNTER — Ambulatory Visit (INDEPENDENT_AMBULATORY_CARE_PROVIDER_SITE_OTHER): Payer: 59 | Admitting: Podiatry

## 2017-12-18 DIAGNOSIS — M2012 Hallux valgus (acquired), left foot: Secondary | ICD-10-CM | POA: Diagnosis not present

## 2017-12-18 NOTE — Progress Notes (Signed)
Subjective:   Patient ID: [name], female   DOB: 47 y.o.   MRN: [id number]   HPI Patient states overall is good but at times it is getting swollen and can become painful in the joint but I am doing all the walking that I want to do is just hard for me to run   ROS      Objective:  Physical Exam  Neurovascular status intact with first metatarsal head that is been traumatized causing a rotation of the hallux with a moderate varus component to the left hallux.  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  I explained all this to her she is moving out of town but we may take care of this depending on how responsible we can give it for more months to completely heal and then make a determination as to what may be necessary.  Patient will have an x-ray performed in [place] and then will have that sent to me and we will decide what might be appropriate for her  X-ray indicates a negative fossa I am angle of the left first metatarsal which is most likely traumatic in origin with tibial sesamoid peaking but no change from April in the x-ray

## 2018-05-29 ENCOUNTER — Telehealth: Payer: Self-pay | Admitting: Podiatry

## 2018-05-29 NOTE — Telephone Encounter (Signed)
Pt has moved to [place] and is having some complications from her previous surgery in December. Please give patient a call.

## 2018-05-29 NOTE — Telephone Encounter (Signed)
Left message for pt to call and I would help to the best of my ability.
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
## 2018-06-02 NOTE — Telephone Encounter (Signed)
Left message for pt to call with Dr. Presley Raddle phone number and Dr. Paulla Dolly will contact him.

## 2018-06-02 NOTE — Telephone Encounter (Signed)
Pt states she sent the x-rays and would send again if needed.

## 2018-06-02 NOTE — Telephone Encounter (Signed)
I'm happy to talk to her. He is a good doc and I'd also be happy to talk to him about it

## 2018-06-02 NOTE — Telephone Encounter (Signed)
Left message informing pt the x-rays had been received and dr. Paulla Dolly stated he would be happy to call and talk to Dr. Glyn Ade if she would call with the phone number.
